# Patient Record
Sex: Female | Born: 1965 | Race: Black or African American | Hispanic: No | Smoking: Never smoker
Health system: Southern US, Community
[De-identification: ages and names within clinical notes are randomized; demographics above are authoritative.]

## PROBLEM LIST (undated history)

## (undated) DIAGNOSIS — F39 Unspecified mood [affective] disorder: Secondary | ICD-10-CM

## (undated) DIAGNOSIS — J302 Other seasonal allergic rhinitis: Secondary | ICD-10-CM

## (undated) DIAGNOSIS — D649 Anemia, unspecified: Secondary | ICD-10-CM

## (undated) DIAGNOSIS — G43909 Migraine, unspecified, not intractable, without status migrainosus: Secondary | ICD-10-CM

## (undated) DIAGNOSIS — I8000 Phlebitis and thrombophlebitis of superficial vessels of unspecified lower extremity: Secondary | ICD-10-CM

## (undated) DIAGNOSIS — M2550 Pain in unspecified joint: Secondary | ICD-10-CM

## (undated) DIAGNOSIS — F419 Anxiety disorder, unspecified: Secondary | ICD-10-CM

## (undated) DIAGNOSIS — Z9889 Other specified postprocedural states: Secondary | ICD-10-CM

## (undated) DIAGNOSIS — R600 Localized edema: Secondary | ICD-10-CM

## (undated) DIAGNOSIS — L988 Other specified disorders of the skin and subcutaneous tissue: Secondary | ICD-10-CM

## (undated) DIAGNOSIS — N951 Menopausal and female climacteric states: Secondary | ICD-10-CM

## (undated) DIAGNOSIS — R011 Cardiac murmur, unspecified: Secondary | ICD-10-CM

## (undated) DIAGNOSIS — R112 Nausea with vomiting, unspecified: Secondary | ICD-10-CM

## (undated) HISTORY — DX: Anemia, unspecified: D64.9

## (undated) HISTORY — DX: Phlebitis and thrombophlebitis of superficial vessels of unspecified lower extremity: I80.00

## (undated) HISTORY — DX: Other seasonal allergic rhinitis: J30.2

## (undated) HISTORY — DX: Menopausal and female climacteric states: N95.1

## (undated) HISTORY — DX: Unspecified mood (affective) disorder: F39

## (undated) HISTORY — PX: HERNIA REPAIR: SHX51

## (undated) HISTORY — DX: Migraine, unspecified, not intractable, without status migrainosus: G43.909

## (undated) HISTORY — DX: Pain in unspecified joint: M25.50

## (undated) HISTORY — DX: Localized edema: R60.0

## (undated) HISTORY — DX: Other specified disorders of the skin and subcutaneous tissue: L98.8

## (undated) HISTORY — DX: Anxiety disorder, unspecified: F41.9

---

## 1992-11-17 HISTORY — PX: TUBAL LIGATION: SHX77

## 2005-07-31 ENCOUNTER — Emergency Department (HOSPITAL_COMMUNITY): Admission: EM | Admit: 2005-07-31 | Discharge: 2005-07-31 | Payer: Self-pay | Admitting: Emergency Medicine

## 2005-12-29 ENCOUNTER — Other Ambulatory Visit: Admission: RE | Admit: 2005-12-29 | Discharge: 2005-12-29 | Payer: Self-pay | Admitting: Obstetrics and Gynecology

## 2008-03-28 ENCOUNTER — Ambulatory Visit (HOSPITAL_COMMUNITY): Admission: RE | Admit: 2008-03-28 | Discharge: 2008-03-28 | Payer: Self-pay | Admitting: Specialist

## 2008-03-28 ENCOUNTER — Encounter: Payer: Self-pay | Admitting: Internal Medicine

## 2008-08-17 ENCOUNTER — Ambulatory Visit: Payer: Self-pay | Admitting: Internal Medicine

## 2008-08-17 DIAGNOSIS — E669 Obesity, unspecified: Secondary | ICD-10-CM | POA: Insufficient documentation

## 2008-08-17 DIAGNOSIS — M79609 Pain in unspecified limb: Secondary | ICD-10-CM | POA: Insufficient documentation

## 2008-08-19 DIAGNOSIS — R030 Elevated blood-pressure reading, without diagnosis of hypertension: Secondary | ICD-10-CM | POA: Insufficient documentation

## 2008-08-19 DIAGNOSIS — F3289 Other specified depressive episodes: Secondary | ICD-10-CM | POA: Insufficient documentation

## 2008-08-19 DIAGNOSIS — J309 Allergic rhinitis, unspecified: Secondary | ICD-10-CM | POA: Insufficient documentation

## 2008-08-19 DIAGNOSIS — I809 Phlebitis and thrombophlebitis of unspecified site: Secondary | ICD-10-CM | POA: Insufficient documentation

## 2008-08-19 DIAGNOSIS — M1990 Unspecified osteoarthritis, unspecified site: Secondary | ICD-10-CM | POA: Insufficient documentation

## 2008-08-19 DIAGNOSIS — G43009 Migraine without aura, not intractable, without status migrainosus: Secondary | ICD-10-CM | POA: Insufficient documentation

## 2008-08-19 DIAGNOSIS — R946 Abnormal results of thyroid function studies: Secondary | ICD-10-CM | POA: Insufficient documentation

## 2008-08-19 DIAGNOSIS — R32 Unspecified urinary incontinence: Secondary | ICD-10-CM | POA: Insufficient documentation

## 2008-08-19 DIAGNOSIS — F4310 Post-traumatic stress disorder, unspecified: Secondary | ICD-10-CM | POA: Insufficient documentation

## 2008-08-19 DIAGNOSIS — F329 Major depressive disorder, single episode, unspecified: Secondary | ICD-10-CM | POA: Insufficient documentation

## 2008-08-19 DIAGNOSIS — D649 Anemia, unspecified: Secondary | ICD-10-CM | POA: Insufficient documentation

## 2008-08-19 DIAGNOSIS — G4700 Insomnia, unspecified: Secondary | ICD-10-CM | POA: Insufficient documentation

## 2008-08-19 DIAGNOSIS — Z8679 Personal history of other diseases of the circulatory system: Secondary | ICD-10-CM | POA: Insufficient documentation

## 2008-10-03 ENCOUNTER — Ambulatory Visit (HOSPITAL_COMMUNITY): Admission: RE | Admit: 2008-10-03 | Discharge: 2008-10-03 | Payer: Self-pay | Admitting: Specialist

## 2008-11-23 ENCOUNTER — Encounter: Admission: RE | Admit: 2008-11-23 | Discharge: 2008-11-23 | Payer: Self-pay | Admitting: Obstetrics and Gynecology

## 2009-02-05 ENCOUNTER — Encounter: Admission: RE | Admit: 2009-02-05 | Discharge: 2009-02-05 | Payer: Self-pay | Admitting: Obstetrics and Gynecology

## 2009-04-02 ENCOUNTER — Encounter: Admission: RE | Admit: 2009-04-02 | Discharge: 2009-04-02 | Payer: Self-pay | Admitting: *Deleted

## 2009-06-20 ENCOUNTER — Emergency Department (HOSPITAL_COMMUNITY): Admission: EM | Admit: 2009-06-20 | Discharge: 2009-06-20 | Payer: Self-pay | Admitting: Emergency Medicine

## 2009-06-22 ENCOUNTER — Encounter: Admission: RE | Admit: 2009-06-22 | Discharge: 2009-09-20 | Payer: Self-pay | Admitting: *Deleted

## 2010-05-18 ENCOUNTER — Ambulatory Visit: Payer: Self-pay | Admitting: Family Medicine

## 2010-05-18 DIAGNOSIS — N3000 Acute cystitis without hematuria: Secondary | ICD-10-CM | POA: Insufficient documentation

## 2010-05-18 LAB — CONVERTED CEMR LAB
Beta hcg, urine, semiquantitative: NEGATIVE
Glucose, Urine, Semiquant: NEGATIVE
Ketones, urine, test strip: NEGATIVE
Nitrite: NEGATIVE
Specific Gravity, Urine: 1.02
Urobilinogen, UA: 0.2
pH: 7

## 2010-10-04 ENCOUNTER — Ambulatory Visit: Payer: Self-pay | Admitting: Emergency Medicine

## 2010-10-04 LAB — CONVERTED CEMR LAB
Bilirubin Urine: NEGATIVE
Glucose, Urine, Semiquant: NEGATIVE
Ketones, urine, test strip: NEGATIVE
pH: 5.5

## 2010-10-05 ENCOUNTER — Encounter: Payer: Self-pay | Admitting: Emergency Medicine

## 2010-10-07 ENCOUNTER — Telehealth (INDEPENDENT_AMBULATORY_CARE_PROVIDER_SITE_OTHER): Payer: Self-pay

## 2010-12-08 ENCOUNTER — Encounter: Payer: Self-pay | Admitting: Specialist
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
## 2010-12-17 NOTE — Progress Notes (Signed)
Summary: Courtesy call  Phone Note Outgoing Call   Call placed by: Areta Haber CMA,  October 07, 2010 9:40 AM Call placed to: Patient Summary of Call: Courtesy call to pt - Per pt's husband, pt was at work. Asked pt's husband to let pt know we clld to check on her and that she could clld to obtain lab results, he stated he would.  Initial call taken by: Areta Haber CMA,  October 07, 2010 9:43 AM     Appended Document: Courtesy call Pt clld back - advsd her of Urine culture results, to make sure she completed all of her antibiotics, to f/u with her PCP if problems persist. Pt stated she understood and would.

## 2011-02-22 LAB — URINE CULTURE: Culture: NO GROWTH

## 2011-02-22 LAB — URINALYSIS, ROUTINE W REFLEX MICROSCOPIC
Glucose, UA: NEGATIVE mg/dL
Leukocytes, UA: NEGATIVE
Urobilinogen, UA: 1 mg/dL (ref 0.0–1.0)
pH: 6 (ref 5.0–8.0)

## 2011-02-22 LAB — LIPASE, BLOOD: Lipase: 14 U/L (ref 11–59)

## 2011-02-22 LAB — COMPREHENSIVE METABOLIC PANEL
AST: 22 U/L (ref 0–37)
Albumin: 4.1 g/dL (ref 3.5–5.2)
Alkaline Phosphatase: 49 U/L (ref 39–117)
BUN: 10 mg/dL (ref 6–23)
CO2: 27 mEq/L (ref 19–32)
Calcium: 9.7 mg/dL (ref 8.4–10.5)
GFR calc non Af Amer: >60 mL/min (ref >60)
Total Bilirubin: 0.6 mg/dL (ref 0.3–1.2)

## 2011-02-22 LAB — CBC
HCT: 37.6 % (ref 36.0–46.0)
RBC: 4.41 MIL/uL (ref 3.87–5.11)

## 2011-02-22 LAB — PREGNANCY, URINE: Preg Test, Ur: POSITIVE

## 2011-02-22 LAB — DIFFERENTIAL
Lymphocytes Relative: 29 % (ref 12–46)
Lymphs Abs: 1.8 10*3/uL (ref 0.7–4.0)
Monocytes Absolute: 0.5 10*3/uL (ref 0.1–1.0)
Neutrophils Relative %: 61 % (ref 43–77)

## 2011-02-22 LAB — URINE MICROSCOPIC-ADD ON

## 2011-02-22 LAB — HCG, QUANTITATIVE, PREGNANCY: hCG, Beta Chain, Quant, S: 9 m[IU]/mL — ABNORMAL HIGH (ref <5)

## 2011-05-18 LAB — HM MAMMOGRAPHY

## 2011-05-23 ENCOUNTER — Other Ambulatory Visit (INDEPENDENT_AMBULATORY_CARE_PROVIDER_SITE_OTHER): Payer: Self-pay | Admitting: General Surgery

## 2011-05-23 ENCOUNTER — Encounter (INDEPENDENT_AMBULATORY_CARE_PROVIDER_SITE_OTHER): Payer: Self-pay | Admitting: General Surgery

## 2011-05-23 ENCOUNTER — Ambulatory Visit (INDEPENDENT_AMBULATORY_CARE_PROVIDER_SITE_OTHER): Payer: Commercial Managed Care - PPO | Admitting: General Surgery

## 2011-05-23 NOTE — Progress Notes (Signed)
Subjective:     Patient ID: Gail Gentry, female   DOB: 1965/12/25, 45 y.o.   MRN: [id number]    BP 130/80  Pulse 64  Ht 5\' 7"  (1.702 m)  Wt 287 lb 8 oz (130.409 kg)  BMI 45.03 kg/m2    HPI This patient is a very pleasant 45 year old female here for consideration for surgical treatment for morbid obesity. 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 She gives an essentially lifelong history of progressive obesity. She has been through N. efforts at nonsurgical weight loss. She actually has typically very good temporary success losing up to 100 pounds at a time but then experiences progressive weight regain. The cycle has been repeated innumerable times. She finds that her weight is beginning to bother her more and more. She has significant joint pain and also stress urinary incontinence. She is very worried about her long-term health going forward at her current weight. Her sister has had successful gastric bypass surgery and is doing very well. She has been to our initial information seminar and reviewed her options carefully and is interested in gastric bypass.  Past Medical History  Diagnosis Date  . Anemia   . Joint pain   . Seasonal allergies   . Edema of both legs    Past Surgical History  Procedure Date  . Tubal ligation 1994  . Hernia repair Umbilical   Current Outpatient Prescriptions  Medication Sig Dispense Refill  . acetaminophen (TYLENOL) 500 MG tablet Take 500 mg by mouth as needed.        . ergocalciferol (VITAMIN D2) 50000 UNITS capsule Take 50,000 Units by mouth 2 (two) times a week.        . FeAsp-FeFum -Suc-C-Thre-B12-FA (MULTIGEN PLUS PO) Take by mouth 2 (two) times daily.        . fexofenadine (ALLEGRA) 180 MG tablet Take 180 mg by mouth daily.        . furosemide (LASIX) 40 MG tablet Take 40 mg by mouth daily.        Marland Kitchen ibuprofen (ADVIL,MOTRIN) 200 MG tablet Take 200 mg by mouth as needed.        Marland Kitchen lisdexamfetamine (VYVANSE) 70 MG capsule  Take 70 mg by mouth every morning.         No Known Allergies    Review of Systems  Constitutional: Positive for fatigue. Negative for fever.  HENT: Negative for hearing loss, nosebleeds, sore throat, trouble swallowing and neck pain.   Eyes: Negative for visual disturbance.  Respiratory: Negative for apnea, cough, chest tightness, shortness of breath, wheezing and stridor.   Cardiovascular: Positive for leg swelling. Negative for chest pain and palpitations.  Gastrointestinal: Negative for nausea, vomiting, abdominal pain, diarrhea, constipation, blood in stool and abdominal distention.  Genitourinary: Positive for urgency. Negative for dysuria and frequency.  Musculoskeletal: Positive for arthralgias.  Neurological: Positive for headaches. Negative for seizures and syncope.  Hematological: Does not bruise/bleed easily.  Psychiatric/Behavioral: Negative for behavioral problems.       Objective:   Physical Exam Gen.: Morbidly obese African American female who otherwise appears well. Skin warm and dry without infection HEENT: No palpable masses or thyromegaly. Sclera nonicteric. Pupils equal round reactive. Oropharynx clear. Lymph nodes: No cervical supraclavicular or inguinal nodes palpable Lungs: Clear equal with no increased work of breathing Cardiovascular: 2/6 systolic ejection murmur. Regular rate and rhythm. No edema. No JVD. Peripheral pulses intact. Abdomen: Obese. Well-healed periumbilical incision. No tenderness. No discernible masses or organomegaly.  Extremities: No joint swelling deformity or edema Neurologic: Alert and fully oriented. Gait normal.    Assessment:     A 45 year old female with progressive morbid obesity unresponsive to multiple efforts at medical management. She presents at a BMI of 45 and comorbidities of chronic joint pain lower extremity edema and stress urinary incontinence. She has significant risk going forward at her current weight and I believe  would have significant benefit with surgical long-term weight loss.    Plan:     We had a long discussion regarding current surgical options including lap band and Roux-en-Y gastric bypass. We discussed gastric bypass in detail including expected results, the nature of the procedure, and expected recovery period we discussed early complications such as anesthetic risk bleeding infection and leakage and possible need for open procedure. We discussed long-term risks including gallstones ulcer or stenosis internal hernia with bowel obstruction and weight regained. We discussed nutritional and vitamin deficiencies. She was given a complete consent form to review.  Following our discussion she strongly desires to proceed with gastric bypass which I believe would be a good choice for her. We will proceed with psychologic and nutritional assessments. We'll obtain routine lab work gallbladder ultrasound and check for H. pylori. We'll see her back following these studies.

## 2011-05-23 NOTE — Patient Instructions (Signed)
Carefully reviewed the consent form and call for any questions

## 2011-06-06 ENCOUNTER — Ambulatory Visit (HOSPITAL_COMMUNITY): Payer: Commercial Managed Care - PPO

## 2011-06-10 ENCOUNTER — Ambulatory Visit (HOSPITAL_COMMUNITY)
Admission: RE | Admit: 2011-06-10 | Discharge: 2011-06-10 | Disposition: A | Discharge status: Home / Self Care | Payer: 59 | Source: Ambulatory Visit | Attending: General Surgery | Admitting: General Surgery

## 2011-06-10 ENCOUNTER — Other Ambulatory Visit (INDEPENDENT_AMBULATORY_CARE_PROVIDER_SITE_OTHER): Payer: Self-pay | Admitting: General Surgery

## 2011-06-10 DIAGNOSIS — Z01818 Encounter for other preprocedural examination: Secondary | ICD-10-CM | POA: Insufficient documentation

## 2011-06-10 DIAGNOSIS — Z6841 Body Mass Index (BMI) 40.0 and over, adult: Secondary | ICD-10-CM | POA: Insufficient documentation

## 2011-06-10 LAB — COMPREHENSIVE METABOLIC PANEL
ALT: 8 U/L (ref 0–35)
AST: 14 U/L (ref 0–37)
CO2: 28 mEq/L (ref 19–32)
Creat: 0.78 mg/dL (ref 0.50–1.10)
Total Bilirubin: 0.6 mg/dL (ref 0.3–1.2)

## 2011-06-10 LAB — CBC WITH DIFFERENTIAL/PLATELET
Eosinophils Absolute: 0.1 10*3/uL (ref 0.0–0.7)
HCT: 37.8 % (ref 36.0–46.0)
Hemoglobin: 11.9 g/dL — ABNORMAL LOW (ref 12.0–15.0)
Lymphs Abs: 1.8 10*3/uL (ref 0.7–4.0)
MCH: 27.3 pg (ref 26.0–34.0)
MCHC: 31.5 g/dL (ref 30.0–36.0)
MCV: 86.7 fL (ref 78.0–100.0)
Neutro Abs: 4.9 10*3/uL (ref 1.7–7.7)
WBC: 7.4 10*3/uL (ref 4.0–10.5)

## 2011-06-10 LAB — LIPID PANEL
Cholesterol: 172 mg/dL (ref 0–200)
Total CHOL/HDL Ratio: 3.1 Ratio
VLDL: 11 mg/dL (ref 0–40)

## 2011-06-10 LAB — TSH: TSH: 0.829 u[IU]/mL (ref 0.350–4.500)

## 2011-06-16 ENCOUNTER — Ambulatory Visit: Payer: Commercial Managed Care - PPO | Admitting: *Deleted

## 2011-06-17 ENCOUNTER — Ambulatory Visit (HOSPITAL_COMMUNITY)
Admission: RE | Admit: 2011-06-17 | Discharge: 2011-06-17 | Disposition: A | Discharge status: Home / Self Care | Payer: 59 | Source: Ambulatory Visit | Attending: General Surgery | Admitting: General Surgery

## 2011-06-17 ENCOUNTER — Other Ambulatory Visit: Payer: Self-pay

## 2011-06-17 DIAGNOSIS — Z13820 Encounter for screening for osteoporosis: Secondary | ICD-10-CM | POA: Insufficient documentation

## 2011-06-17 DIAGNOSIS — N393 Stress incontinence (female) (male): Secondary | ICD-10-CM | POA: Insufficient documentation

## 2011-06-17 DIAGNOSIS — M2550 Pain in unspecified joint: Secondary | ICD-10-CM | POA: Insufficient documentation

## 2011-06-17 DIAGNOSIS — Z6841 Body Mass Index (BMI) 40.0 and over, adult: Secondary | ICD-10-CM | POA: Insufficient documentation

## 2011-06-29 ENCOUNTER — Inpatient Hospital Stay (INDEPENDENT_AMBULATORY_CARE_PROVIDER_SITE_OTHER)
Admission: RE | Admit: 2011-06-29 | Discharge: 2011-06-29 | Disposition: A | Discharge status: Home / Self Care | Payer: 59 | Source: Ambulatory Visit | Attending: Emergency Medicine | Admitting: Emergency Medicine

## 2011-06-29 ENCOUNTER — Encounter: Payer: Self-pay | Admitting: Emergency Medicine

## 2011-06-29 DIAGNOSIS — N390 Urinary tract infection, site not specified: Secondary | ICD-10-CM

## 2011-06-29 LAB — CONVERTED CEMR LAB
Bilirubin Urine: NEGATIVE
Glucose, Urine, Semiquant: NEGATIVE
Ketones, urine, test strip: NEGATIVE
Nitrite: NEGATIVE
Specific Gravity, Urine: 1.025
Urobilinogen, UA: 0.2
pH: 5.5

## 2011-07-01 ENCOUNTER — Encounter: Payer: Self-pay | Admitting: Family Medicine

## 2011-07-01 ENCOUNTER — Inpatient Hospital Stay (INDEPENDENT_AMBULATORY_CARE_PROVIDER_SITE_OTHER)
Admission: RE | Admit: 2011-07-01 | Discharge: 2011-07-01 | Disposition: A | Discharge status: Home / Self Care | Payer: 59 | Source: Ambulatory Visit | Attending: Family Medicine | Admitting: Family Medicine

## 2011-07-01 DIAGNOSIS — T7840XA Allergy, unspecified, initial encounter: Secondary | ICD-10-CM

## 2011-07-01 DIAGNOSIS — R21 Rash and other nonspecific skin eruption: Secondary | ICD-10-CM

## 2011-07-07 ENCOUNTER — Encounter: Payer: Self-pay | Admitting: *Deleted

## 2011-07-07 ENCOUNTER — Encounter: Payer: 59 | Attending: General Surgery | Admitting: *Deleted

## 2011-07-07 DIAGNOSIS — Z713 Dietary counseling and surveillance: Secondary | ICD-10-CM | POA: Insufficient documentation

## 2011-07-07 DIAGNOSIS — Z01818 Encounter for other preprocedural examination: Secondary | ICD-10-CM | POA: Insufficient documentation

## 2011-07-07 NOTE — Progress Notes (Signed)
  Patient was seen on 07/07/2011 for Pre-Operative Gastric Bypass Nutrition Assessment. Assessment and letter of approval faxed to [facility] Bariatric Surgery Program coordinator on 07/07/2011.    Patient to call for Pre-Op and Post-Op Nutrition Education at [facility] when surgery is scheduled.  *Note pt seen from 10:45am - 11:30am

## 2011-07-07 NOTE — Patient Instructions (Signed)
   Follow Pre-Op Nutrition Goals to prepare for Gastric Bypass Surgery.   Call the [facility] at [phone] once you have been given your surgery date to enrolled in the Pre-Op Nutrition Class. You will need to attend this nutrition class 3-4 weeks prior to your surgery. 

## 2011-08-20 ENCOUNTER — Emergency Department (HOSPITAL_COMMUNITY): Payer: 59

## 2011-08-20 ENCOUNTER — Emergency Department (HOSPITAL_COMMUNITY)
Admission: EM | Admit: 2011-08-20 | Discharge: 2011-08-20 | Disposition: A | Discharge status: Home / Self Care | Payer: 59 | Attending: Emergency Medicine | Admitting: Emergency Medicine

## 2011-08-20 DIAGNOSIS — R109 Unspecified abdominal pain: Secondary | ICD-10-CM | POA: Insufficient documentation

## 2011-08-20 DIAGNOSIS — K5900 Constipation, unspecified: Secondary | ICD-10-CM | POA: Insufficient documentation

## 2011-08-20 DIAGNOSIS — R197 Diarrhea, unspecified: Secondary | ICD-10-CM | POA: Insufficient documentation

## 2011-08-20 LAB — URINALYSIS, ROUTINE W REFLEX MICROSCOPIC
Nitrite: NEGATIVE
Protein, ur: NEGATIVE mg/dL
Specific Gravity, Urine: 1.029 (ref 1.005–1.030)
Urobilinogen, UA: 1 mg/dL (ref 0.0–1.0)

## 2011-08-20 LAB — BASIC METABOLIC PANEL
CO2: 26 mEq/L (ref 19–32)
Calcium: 9.4 mg/dL (ref 8.4–10.5)
GFR calc non Af Amer: >90 mL/min (ref >90)
Glucose, Bld: 94 mg/dL (ref 70–99)
Potassium: 5.4 mEq/L — ABNORMAL HIGH (ref 3.5–5.1)
Sodium: 137 mEq/L (ref 135–145)

## 2011-08-20 LAB — CBC
Hemoglobin: 11.5 g/dL — ABNORMAL LOW (ref 12.0–15.0)
MCH: 28 pg (ref 26.0–34.0)
MCHC: 33.3 g/dL (ref 30.0–36.0)
Platelets: 288 10*3/uL (ref 150–400)
RBC: 4.11 MIL/uL (ref 3.87–5.11)

## 2011-08-20 LAB — DIFFERENTIAL
Basophils Absolute: 0 10*3/uL (ref 0.0–0.1)
Basophils Relative: 0 % (ref 0–1)
Eosinophils Absolute: 0.1 10*3/uL (ref 0.0–0.7)
Monocytes Relative: 6 % (ref 3–12)
Neutro Abs: 4.1 10*3/uL (ref 1.7–7.7)
Neutrophils Relative %: 60 % (ref 43–77)

## 2011-08-20 LAB — POTASSIUM: Potassium: 3.8 mEq/L (ref 3.5–5.1)

## 2011-08-21 LAB — URINE CULTURE

## 2011-09-14 ENCOUNTER — Encounter: Payer: Self-pay | Admitting: Family Medicine

## 2011-09-14 ENCOUNTER — Inpatient Hospital Stay (INDEPENDENT_AMBULATORY_CARE_PROVIDER_SITE_OTHER)
Admission: RE | Admit: 2011-09-14 | Discharge: 2011-09-14 | Disposition: A | Discharge status: Home / Self Care | Payer: 59 | Source: Ambulatory Visit | Attending: Family Medicine | Admitting: Family Medicine

## 2011-09-14 DIAGNOSIS — J029 Acute pharyngitis, unspecified: Secondary | ICD-10-CM

## 2011-09-14 DIAGNOSIS — J069 Acute upper respiratory infection, unspecified: Secondary | ICD-10-CM

## 2011-09-14 LAB — CONVERTED CEMR LAB: Rapid Strep: NEGATIVE

## 2011-09-19 ENCOUNTER — Telehealth (INDEPENDENT_AMBULATORY_CARE_PROVIDER_SITE_OTHER): Payer: Self-pay | Admitting: Emergency Medicine

## 2011-10-02 ENCOUNTER — Ambulatory Visit: Payer: 59

## 2011-10-14 ENCOUNTER — Encounter: Payer: 59 | Admitting: *Deleted

## 2011-10-14 ENCOUNTER — Encounter: Payer: Self-pay | Admitting: *Deleted

## 2011-10-16 ENCOUNTER — Encounter: Payer: 59 | Attending: General Surgery | Admitting: *Deleted

## 2011-10-16 DIAGNOSIS — Z713 Dietary counseling and surveillance: Secondary | ICD-10-CM | POA: Insufficient documentation

## 2011-10-16 DIAGNOSIS — Z9884 Bariatric surgery status: Secondary | ICD-10-CM | POA: Insufficient documentation

## 2011-10-16 DIAGNOSIS — Z01818 Encounter for other preprocedural examination: Secondary | ICD-10-CM | POA: Insufficient documentation

## 2011-10-16 NOTE — Progress Notes (Signed)
  Bariatric Class:  Appt start time: 1700 end time:  1800.  Pre-Operative Nutrition Class  Patient was seen on 10/16/2011 for Pre-Operative Nutrition education at [facility].   Surgery date: 12/17 Surgery type: Gastric Bypass  Weight today: 304.7 lb Weight change: 18.1 lb gain Total weight lost: n/a BMI: 47.8  Start wt @ [facility]: 286.6 lbs  Samples given per MNT protocol:  Bariatric Advantage Multivitamin  Lot # [id number]  Exp: 9/13   Bariatric Advantage Calcium Citrate  Lot # [id number]  Exp: 12/13   Celebrate Vitamins Multivitamin  Lot # [id number]  Exp: 6/14   Celebrate VitaminsCalcium Citrate  Lot # [id number]  Exp: 9/13   The following the learning objective met by the patient during this course:   Identifies Pre-Op Dietary Goals and will begin 2 weeks pre-operatively   Identifies appropriate sources of fluids and proteins   States protein recommendations and appropriate sources pre and post-operatively  Identifies Post-Operative Dietary Goals and will follow for 2 weeks post-operatively  Identifies appropriate multivitamin and calcium sources  Describes the need for physical activity post-operatively and will follow MD recommendations  States when to call healthcare provider regarding medication questions or post-operative complications  Handouts given during class include:  Pre-Op Bariatric Surgery Diet Handout  Protein Shake Handout  Post-Op Bariatric Surgery Nutrition Handout  BELT Program Information Flyer  Support Group Information Flyer  Follow-Up Plan: Patient will follow-up at [facility] 2 weeks post operatively for diet advancement per MD.

## 2011-10-16 NOTE — Patient Instructions (Signed)
Follow:    Pre-Op Diet per MD 2 weeks prior to surgery  Phase 2- Liquids (clear/full) 2 weeks after surgery  Vitamin/Mineral/Calcium guidelines for purchasing bariatric supplements  Exercise guidelines pre and post-op per MD  Follow-up at [facility] in 2 weeks post-op for diet advancement. Contact Jafet Wissing as needed with questions/concerns.  
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
## 2011-10-20 NOTE — Telephone Encounter (Signed)
  Phone Note Outgoing Call   Call placed by: Lavell Islam RN,  September 19, 2011 10:54 AM Call placed to: Patient Action Taken: Phone Call Completed Summary of Call: Left message on voice mail inquiring about patient's status; informing her that 48 hr throat culture negative; encouraging her to call with questions/concerns. Initial call taken by: Lavell Islam RN,  September 19, 2011 10:55 AM
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
## 2011-10-24 ENCOUNTER — Encounter (INDEPENDENT_AMBULATORY_CARE_PROVIDER_SITE_OTHER): Payer: Self-pay | Admitting: General Surgery

## 2011-10-24 ENCOUNTER — Ambulatory Visit (INDEPENDENT_AMBULATORY_CARE_PROVIDER_SITE_OTHER): Payer: Commercial Managed Care - PPO | Admitting: General Surgery

## 2011-10-24 VITALS — BP 124/84 | HR 72 | Temp 96.9°F | Resp 16 | Ht 66.0 in | Wt 300.4 lb

## 2011-10-24 DIAGNOSIS — E669 Obesity, unspecified: Secondary | ICD-10-CM

## 2011-10-24 NOTE — Progress Notes (Signed)
Subjective:   Morbid Obesity  Patient ID: Gail Gentry, female   DOB: [birth year], 45 y.o.   MRN: [id number]  HPI The patient returns to the office for her preoperative visit prior to planned laparoscopic Roux-en-Y gastric bypass. There is been no change in her health since her last visit. We reviewed her workup. There were no concerns on psychologic or nutritional evaluation. Preoperative chest x-ray, abdominal ultrasound, EKG, and lab work were unremarkable. H. pylori was negative. We again reviewed the procedure. The consent form was reviewed line by line and all her questions were answered.  Past Medical History  Diagnosis Date  . Anemia   . Joint pain   . Seasonal allergies   . Edema of both legs    Past Surgical History  Procedure Date  . Tubal ligation 1994  . Hernia repair Umbilical   Current Outpatient Prescriptions  Medication Sig Dispense Refill  . acetaminophen (TYLENOL) 500 MG tablet Take 500 mg by mouth as needed.        . ergocalciferol (VITAMIN D2) 50000 UNITS capsule Take 50,000 Units by mouth 2 (two) times a week.        . FeAsp-FeFum -Suc-C-Thre-B12-FA (MULTIGEN PLUS PO) Take by mouth 2 (two) times daily.        . fexofenadine (ALLEGRA) 180 MG tablet Take 180 mg by mouth daily.        . furosemide (LASIX) 40 MG tablet Take 40 mg by mouth daily.        Marland Kitchen ibuprofen (ADVIL,MOTRIN) 200 MG tablet Take 200 mg by mouth as needed.        Marland Kitchen lisdexamfetamine (VYVANSE) 70 MG capsule Take 70 mg by mouth every morning.         Allergies  Allergen Reactions  . Benadryl (Altaryl)   . Sulfa Antibiotics   . Tomato    History  Substance Use Topics  . Smoking status: Never Smoker   . Smokeless tobacco: Not on file  . Alcohol Use: No    Review of Systems  Respiratory: Negative.   Cardiovascular: Negative.   Musculoskeletal: Positive for arthralgias.       Objective:   Physical Exam Physical Exam  Gen.: Morbidly obese African American female who otherwise appears  well.  Skin warm and dry without infection  HEENT: No palpable masses or thyromegaly. Sclera nonicteric. Pupils equal round reactive. Oropharynx clear.  Lymph nodes: No cervical supraclavicular or inguinal nodes palpable  Lungs: Clear equal with no increased work of breathing  Cardiovascular: 2/6 systolic ejection murmur. Regular rate and rhythm. No edema. No JVD. Peripheral pulses intact.  Abdomen: Obese. Well-healed periumbilical incision. No tenderness. No discernible masses or organomegaly.  Extremities: No joint swelling deformity or edema  Neurologic: Alert and fully oriented. Gait normal     Assessment:     45 year old female with progressive morbid obesity unresponsive to numerous attempts at medical management and presents at 287 pounds and BMI of 45 With comorbidities of chronic joint pain and stress urinary incontinence    Plan:     Laparoscopic Roux-en-Y gastric bypass

## 2011-10-24 NOTE — Patient Instructions (Signed)
Follow preop diet and bowel prep instructions and call us for any questions

## 2011-10-27 ENCOUNTER — Encounter (HOSPITAL_COMMUNITY): Payer: Self-pay | Admitting: Pharmacy Technician

## 2011-10-29 ENCOUNTER — Encounter (HOSPITAL_COMMUNITY): Payer: Self-pay

## 2011-10-29 ENCOUNTER — Encounter (HOSPITAL_COMMUNITY)
Admission: RE | Admit: 2011-10-29 | Discharge: 2011-10-29 | Disposition: A | Discharge status: Home / Self Care | Payer: 59 | Source: Ambulatory Visit | Attending: General Surgery | Admitting: General Surgery

## 2011-10-29 HISTORY — DX: Other specified postprocedural states: Z98.890

## 2011-10-29 HISTORY — DX: Cardiac murmur, unspecified: R01.1

## 2011-10-29 HISTORY — DX: Other specified postprocedural states: R11.2

## 2011-10-29 LAB — CBC
HCT: 36.2 % (ref 36.0–46.0)
Hemoglobin: 11.8 g/dL — ABNORMAL LOW (ref 12.0–15.0)
MCV: 82.8 fL (ref 78.0–100.0)
RBC: 4.37 MIL/uL (ref 3.87–5.11)
RDW: 13.7 % (ref 11.5–15.5)
WBC: 8.2 10*3/uL (ref 4.0–10.5)

## 2011-10-29 LAB — COMPREHENSIVE METABOLIC PANEL
Albumin: 3.8 g/dL (ref 3.5–5.2)
Alkaline Phosphatase: 56 U/L (ref 39–117)
BUN: 16 mg/dL (ref 6–23)
CO2: 26 mEq/L (ref 19–32)
Chloride: 100 mEq/L (ref 96–112)
Creatinine, Ser: 0.69 mg/dL (ref 0.50–1.10)
GFR calc non Af Amer: >90 mL/min (ref >90)
Glucose, Bld: 81 mg/dL (ref 70–99)
Potassium: 3.6 mEq/L (ref 3.5–5.1)
Total Bilirubin: 0.3 mg/dL (ref 0.3–1.2)

## 2011-10-29 LAB — DIFFERENTIAL
Eosinophils Relative: 2 % (ref 0–5)
Lymphocytes Relative: 28 % (ref 12–46)
Lymphs Abs: 2.3 10*3/uL (ref 0.7–4.0)
Monocytes Absolute: 0.6 10*3/uL (ref 0.1–1.0)
Neutro Abs: 5.2 10*3/uL (ref 1.7–7.7)

## 2011-10-29 LAB — HCG, SERUM, QUALITATIVE: Preg, Serum: NEGATIVE

## 2011-10-29 NOTE — Pre-Procedure Instructions (Signed)
ekg and 2 view chest xray 06-17-11 in epic

## 2011-10-29 NOTE — Patient Instructions (Addendum)
20 Gail Gentry  10/29/2011   Your procedure is scheduled on:11-03-2011  Report to [facility] at 915 AM.  Call this number if you have problems the morning of surgery: [phone]   Remember:   Do not eat food:After Midnight.  May have clear liquids:until Midnight .  Clear liquids include soda, tea, black coffee, apple or grape juice, broth.  Take these medicines the morning of surgery with A SIP OF WATER: alleegra, vyvanse   Do not wear jewelry, make-up or nail polish.  Do not wear lotions, powders, or perfumes. .  Do not shave 48 hours prior to surgery.  Do not bring valuables to the hospital.  Contacts, dentures or bridgework may not be worn into surgery.  Leave suitcase in the car. After surgery it may be brought to your room.  For patients admitted to the hospital, checkout time is 11:00 AM the day of discharge.   Special Instructions: CHG Shower Use Special Wash: 1/2 bottle night before surgery and 1/2 bottle morning of surgery.neck down avoid private area   Please read over the following fact sheets that you were given: MRSA Information, incentive spirometer fact sheet  [name] [name] rn [facility] pre op nurse [phone] phone number

## 2011-10-31 NOTE — Pre-Procedure Instructions (Signed)
1210- no antibiotic as to now in Epic- called office- spoke with Marcelino Duster at office in nursing office who stated she would talk with Dr Carmie Kanner nurse as he is on vacation

## 2011-10-31 NOTE — Pre-Procedure Instructions (Signed)
No order as of yet for pre op antibiotics   [phone]

## 2011-11-03 ENCOUNTER — Inpatient Hospital Stay (HOSPITAL_COMMUNITY): Payer: 59 | Admitting: Anesthesiology

## 2011-11-03 ENCOUNTER — Encounter (HOSPITAL_COMMUNITY): Payer: Self-pay | Admitting: Anesthesiology

## 2011-11-03 ENCOUNTER — Encounter (HOSPITAL_COMMUNITY)
Admission: RE | Disposition: A | Discharge status: Home / Self Care | Payer: Self-pay | Source: Ambulatory Visit | Attending: General Surgery

## 2011-11-03 ENCOUNTER — Encounter (HOSPITAL_COMMUNITY): Payer: Self-pay | Admitting: *Deleted

## 2011-11-03 ENCOUNTER — Inpatient Hospital Stay (HOSPITAL_COMMUNITY)
Admission: RE | Admit: 2011-11-03 | Discharge: 2011-11-05 | DRG: 621 | Disposition: A | Discharge status: Home / Self Care | Payer: 59 | Source: Ambulatory Visit | Attending: General Surgery | Admitting: General Surgery

## 2011-11-03 DIAGNOSIS — Z96659 Presence of unspecified artificial knee joint: Secondary | ICD-10-CM

## 2011-11-03 DIAGNOSIS — E669 Obesity, unspecified: Secondary | ICD-10-CM

## 2011-11-03 DIAGNOSIS — Z6841 Body Mass Index (BMI) 40.0 and over, adult: Secondary | ICD-10-CM

## 2011-11-03 HISTORY — PX: GASTRIC ROUX-EN-Y: SHX5262

## 2011-11-03 SURGERY — LAPAROSCOPIC ROUX-EN-Y GASTRIC
Anesthesia: General | Site: Abdomen | Wound class: Clean Contaminated

## 2011-11-03 MED ORDER — BUPIVACAINE-EPINEPHRINE 0.25% -1:200000 IJ SOLN
INTRAMUSCULAR | Status: AC
  Filled 2011-11-03: qty 1

## 2011-11-03 MED ORDER — CISATRACURIUM BESYLATE 2 MG/ML IV SOLN
INTRAVENOUS | Status: DC | PRN
  Administered 2011-11-03: 10 mg via INTRAVENOUS
  Administered 2011-11-03 (×5): 2 mg via INTRAVENOUS

## 2011-11-03 MED ORDER — PROMETHAZINE HCL 25 MG/ML IJ SOLN: 6.2500 mg | INTRAMUSCULAR | Status: DC | PRN

## 2011-11-03 MED ORDER — TISSEEL VH 10 ML EX KIT
PACK | CUTANEOUS | Status: DC | PRN
  Administered 2011-11-03: 10 mL

## 2011-11-03 MED ORDER — EPHEDRINE SULFATE 50 MG/ML IJ SOLN
INTRAMUSCULAR | Status: DC | PRN
  Administered 2011-11-03: 10 mg via INTRAVENOUS

## 2011-11-03 MED ORDER — LACTATED RINGERS IV SOLN
INTRAVENOUS | Status: DC
  Administered 2011-11-03: 1000 mL via INTRAVENOUS

## 2011-11-03 MED ORDER — NEOSTIGMINE METHYLSULFATE 1 MG/ML IJ SOLN
INTRAMUSCULAR | Status: DC | PRN
  Administered 2011-11-03: 5 mg via INTRAVENOUS

## 2011-11-03 MED ORDER — HEPARIN SODIUM (PORCINE) 5000 UNIT/ML IJ SOLN
5000.0000 [IU] | Freq: Once | INTRAMUSCULAR | Status: AC
  Administered 2011-11-03: 5000 [IU] via SUBCUTANEOUS

## 2011-11-03 MED ORDER — DEXAMETHASONE SODIUM PHOSPHATE 4 MG/ML IJ SOLN
INTRAMUSCULAR | Status: DC | PRN
  Administered 2011-11-03: 10 mg via INTRAVENOUS

## 2011-11-03 MED ORDER — HEPARIN SODIUM (PORCINE) 5000 UNIT/ML IJ SOLN
5000.0000 [IU] | Freq: Three times a day (TID) | INTRAMUSCULAR | Status: DC
Start: 2011-11-03 — End: 2011-11-05
  Administered 2011-11-03 – 2011-11-05 (×5): 5000 [IU] via SUBCUTANEOUS
  Filled 2011-11-03 (×8): qty 1

## 2011-11-03 MED ORDER — GLYCOPYRROLATE 0.2 MG/ML IJ SOLN
INTRAMUSCULAR | Status: DC | PRN
  Administered 2011-11-03: .6 mg via INTRAVENOUS

## 2011-11-03 MED ORDER — LIDOCAINE HCL (CARDIAC) 20 MG/ML IV SOLN
INTRAVENOUS | Status: DC | PRN
  Administered 2011-11-03: 40 mg via INTRAVENOUS

## 2011-11-03 MED ORDER — CEFOXITIN SODIUM-DEXTROSE 1-4 GM-% IV SOLR (PREMIX)
INTRAVENOUS | Status: AC
  Filled 2011-11-03: qty 100

## 2011-11-03 MED ORDER — LACTATED RINGERS IV SOLN
INTRAVENOUS | Status: DC | PRN
  Administered 2011-11-03 (×3): via INTRAVENOUS

## 2011-11-03 MED ORDER — LACTATED RINGERS IV SOLN
INTRAVENOUS | Status: DC
  Administered 2011-11-03 – 2011-11-04 (×2): via INTRAVENOUS

## 2011-11-03 MED ORDER — DEXTROSE 5 % IV SOLN: 2.0000 g | Freq: Once | INTRAVENOUS | Status: DC

## 2011-11-03 MED ORDER — UNJURY VANILLA POWDER: 2.0000 [oz_av] | Freq: Four times a day (QID) | ORAL | Status: DC

## 2011-11-03 MED ORDER — PROPOFOL 10 MG/ML IV EMUL
INTRAVENOUS | Status: DC | PRN
  Administered 2011-11-03: 200 mg via INTRAVENOUS

## 2011-11-03 MED ORDER — OXYCODONE-ACETAMINOPHEN 5-325 MG/5ML PO SOLN
5.0000 mL | ORAL | Status: DC | PRN
  Administered 2011-11-04 – 2011-11-05 (×3): 10 mL via ORAL
  Filled 2011-11-03 (×3): qty 10

## 2011-11-03 MED ORDER — HYDROMORPHONE HCL PF 1 MG/ML IJ SOLN: 0.2500 mg | INTRAMUSCULAR | Status: DC | PRN

## 2011-11-03 MED ORDER — MORPHINE SULFATE 2 MG/ML IJ SOLN
2.0000 mg | INTRAMUSCULAR | Status: DC | PRN
  Administered 2011-11-03 – 2011-11-04 (×4): 2 mg via INTRAVENOUS
  Filled 2011-11-03 (×4): qty 1

## 2011-11-03 MED ORDER — ACETAMINOPHEN 10 MG/ML IV SOLN
INTRAVENOUS | Status: AC
  Filled 2011-11-03: qty 100

## 2011-11-03 MED ORDER — HYDROMORPHONE HCL PF 1 MG/ML IJ SOLN
INTRAMUSCULAR | Status: DC | PRN
  Administered 2011-11-03: .5 mg via INTRAVENOUS
  Administered 2011-11-03 (×3): 0.5 mg via INTRAVENOUS

## 2011-11-03 MED ORDER — LACTATED RINGERS IR SOLN
Status: DC | PRN
  Administered 2011-11-03: 3000 mL

## 2011-11-03 MED ORDER — BUPIVACAINE-EPINEPHRINE 0.25% -1:200000 IJ SOLN
INTRAMUSCULAR | Status: DC | PRN
  Administered 2011-11-03: 35 mL

## 2011-11-03 MED ORDER — ACETAMINOPHEN 10 MG/ML IV SOLN
INTRAVENOUS | Status: DC | PRN
  Administered 2011-11-03: 1000 mg via INTRAVENOUS

## 2011-11-03 MED ORDER — ONDANSETRON HCL 4 MG/2ML IJ SOLN
INTRAMUSCULAR | Status: DC | PRN
  Administered 2011-11-03 (×2): 2 mg via INTRAVENOUS

## 2011-11-03 MED ORDER — ONDANSETRON HCL 4 MG/2ML IJ SOLN
4.0000 mg | INTRAMUSCULAR | Status: DC | PRN
  Administered 2011-11-04: 4 mg via INTRAVENOUS
  Filled 2011-11-03: qty 2

## 2011-11-03 MED ORDER — FENTANYL CITRATE 0.05 MG/ML IJ SOLN
INTRAMUSCULAR | Status: DC | PRN
  Administered 2011-11-03: 50 ug via INTRAVENOUS
  Administered 2011-11-03: 100 ug via INTRAVENOUS
  Administered 2011-11-03 (×2): 50 ug via INTRAVENOUS

## 2011-11-03 MED ORDER — SUCCINYLCHOLINE CHLORIDE 20 MG/ML IJ SOLN
INTRAMUSCULAR | Status: DC | PRN
  Administered 2011-11-03: 100 mg via INTRAVENOUS

## 2011-11-03 MED ORDER — UNJURY CHICKEN SOUP POWDER: 2.0000 [oz_av] | Freq: Four times a day (QID) | ORAL | Status: DC

## 2011-11-03 MED ORDER — UNJURY CHOCOLATE CLASSIC POWDER: 2.0000 [oz_av] | Freq: Four times a day (QID) | ORAL | Status: DC

## 2011-11-03 MED ORDER — DEXTROSE 5 % IV SOLN
2.0000 g | Freq: Once | INTRAVENOUS | Status: AC
  Administered 2011-11-03: 2 g via INTRAVENOUS
  Filled 2011-11-03: qty 2

## 2011-11-03 MED ORDER — ACETAMINOPHEN 160 MG/5ML PO SOLN: 650.0000 mg | ORAL | Status: DC | PRN

## 2011-11-03 SURGICAL SUPPLY — 80 items
ADH SKN CLS APL DERMABOND .7 (GAUZE/BANDAGES/DRESSINGS)
APL SKNCLS STERI-STRIP NONHPOA (GAUZE/BANDAGES/DRESSINGS)
APL SRG 32X5 SNPLK LF DISP (MISCELLANEOUS) ×1
APPLICATOR COTTON TIP 6IN STRL (MISCELLANEOUS) ×4 IMPLANT
APPLIER CLIP ROT 13.4 12 LRG (CLIP)
APR CLP LRG 13.4X12 ROT 20 MLT (CLIP)
BAG SPEC RTRVL LRG 6X4 10 (ENDOMECHANICALS)
BENZOIN TINCTURE PRP APPL 2/3 (GAUZE/BANDAGES/DRESSINGS) IMPLANT
BLADE SURG 15 STRL LF DISP TIS (BLADE) IMPLANT
BLADE SURG 15 STRL SS (BLADE)
BLADE SURG SZ11 CARB STEEL (BLADE) ×2 IMPLANT
CABLE HIGH FREQUENCY MONO STRZ (ELECTRODE) ×2 IMPLANT
CANISTER SUCTION 2500CC (MISCELLANEOUS) ×2 IMPLANT
CLIP APPLIE ROT 13.4 12 LRG (CLIP) IMPLANT
CLIP SUT LAPRA TY ABSORB (SUTURE) ×4 IMPLANT
CLOTH BEACON ORANGE TIMEOUT ST (SAFETY) ×2 IMPLANT
COVER SURGICAL LIGHT HANDLE (MISCELLANEOUS) ×2 IMPLANT
CUTTER LINEAR ENDO ART 45 ETS (STAPLE) ×2 IMPLANT
DERMABOND ADVANCED (GAUZE/BANDAGES/DRESSINGS)
DERMABOND ADVANCED .7 DNX12 (GAUZE/BANDAGES/DRESSINGS) IMPLANT
DEVICE SUTURE ENDOST 10MM (ENDOMECHANICALS) ×2 IMPLANT
DISSECTOR BLUNT TIP ENDO 5MM (MISCELLANEOUS) IMPLANT
DRAIN PENROSE 18X1/4 LTX STRL (WOUND CARE) ×2 IMPLANT
DRAPE CAMERA CLOSED 9X96 (DRAPES) ×2 IMPLANT
ELECT REM PT RETURN 9FT ADLT (ELECTROSURGICAL) ×2
ELECTRODE REM PT RTRN 9FT ADLT (ELECTROSURGICAL) ×1 IMPLANT
GAUZE SPONGE 4X4 16PLY XRAY LF (GAUZE/BANDAGES/DRESSINGS) ×2 IMPLANT
GLOVE BIOGEL PI IND STRL 7.0 (GLOVE) ×1 IMPLANT
GLOVE BIOGEL PI INDICATOR 7.0 (GLOVE) ×1
GOWN STRL NON-REIN LRG LVL3 (GOWN DISPOSABLE) ×2 IMPLANT
GOWN STRL REIN XL XLG (GOWN DISPOSABLE) ×4 IMPLANT
HEMOSTAT SURGICEL 4X8 (HEMOSTASIS) IMPLANT
HOVERMATT SINGLE USE (MISCELLANEOUS) ×2 IMPLANT
KIT BASIN OR (CUSTOM PROCEDURE TRAY) ×2 IMPLANT
KIT GASTRIC LAVAGE 34FR ADT (SET/KITS/TRAYS/PACK) ×2 IMPLANT
NDL SPNL 22GX3.5 QUINCKE BK (NEEDLE) ×1 IMPLANT
NEEDLE SPNL 22GX3.5 QUINCKE BK (NEEDLE) ×2 IMPLANT
NS IRRIG 1000ML POUR BTL (IV SOLUTION) ×2 IMPLANT
PACK CARDIOVASCULAR III (CUSTOM PROCEDURE TRAY) ×2 IMPLANT
PEN SKIN MARKING BROAD (MISCELLANEOUS) ×2 IMPLANT
POUCH SPECIMEN RETRIEVAL 10MM (ENDOMECHANICALS) IMPLANT
RELOAD 45 VASCULAR/THIN (ENDOMECHANICALS) ×4 IMPLANT
RELOAD BLUE (STAPLE) ×4 IMPLANT
RELOAD ENDO STITCH 2.0 (ENDOMECHANICALS) ×22
RELOAD GOLD (STAPLE) ×2 IMPLANT
RELOAD STAPLE 45 2.5 WHT GRN (ENDOMECHANICALS) ×2 IMPLANT
RELOAD STAPLE 45 3.5 BLU ETS (ENDOMECHANICALS) ×1 IMPLANT
RELOAD STAPLE TA45 3.5 REG BLU (ENDOMECHANICALS) ×4 IMPLANT
RELOAD SUT SNGL STCH BLK 2-0 (ENDOMECHANICALS) ×3 IMPLANT
RELOAD WHITE ECR60W (STAPLE) ×1 IMPLANT
SCALPEL HARMONIC ACE (MISCELLANEOUS) ×2 IMPLANT
SCISSORS LAP 5X35 DISP (ENDOMECHANICALS) ×2 IMPLANT
SEALANT SURGICAL APPL DUAL CAN (MISCELLANEOUS) ×2 IMPLANT
SET IRRIG TUBING LAPAROSCOPIC (IRRIGATION / IRRIGATOR) ×2 IMPLANT
SLEEVE ADV FIXATION 12X100MM (TROCAR) ×4 IMPLANT
SLEEVE ADV FIXATION 5X100MM (TROCAR) ×2 IMPLANT
SOLUTION ANTI FOG 6CC (MISCELLANEOUS) ×2 IMPLANT
SPONGE GAUZE 4X4 12PLY (GAUZE/BANDAGES/DRESSINGS) ×2 IMPLANT
STAPLER STANDARD HANDLE (STAPLE) ×2 IMPLANT
STAPLER VISISTAT 35W (STAPLE) ×2 IMPLANT
STRIP CLOSURE SKIN 1/2X4 (GAUZE/BANDAGES/DRESSINGS) IMPLANT
SUT ETHILON 3 0 PS 1 (SUTURE) IMPLANT
SUT MNCRL AB 4-0 PS2 18 (SUTURE) ×2 IMPLANT
SUT RELOAD ENDO STITCH 2 48X1 (ENDOMECHANICALS) ×5
SUT RELOAD ENDO STITCH 2.0 (ENDOMECHANICALS) ×6
SUT VIC AB 2-0 SH 27 (SUTURE) ×2
SUT VIC AB 2-0 SH 27X BRD (SUTURE) ×1 IMPLANT
SUTURE RELOAD END STTCH 2 48X1 (ENDOMECHANICALS) ×5 IMPLANT
SUTURE RELOAD ENDO STITCH 2.0 (ENDOMECHANICALS) ×6 IMPLANT
SYR 20CC LL (SYRINGE) ×2 IMPLANT
SYR 50ML LL SCALE MARK (SYRINGE) ×2 IMPLANT
SYR CONTROL 10ML LL (SYRINGE) ×2 IMPLANT
TOWEL OR 17X26 10 PK STRL BLUE (TOWEL DISPOSABLE) ×2 IMPLANT
TRAY FOLEY CATH 14FRSI W/METER (CATHETERS) ×2 IMPLANT
TROCAR ADV FIXATION 12X100MM (TROCAR) ×2 IMPLANT
TROCAR XCEL 12X100 BLDLESS (ENDOMECHANICALS) ×2 IMPLANT
TROCAR Z-THREAD FIOS 5X100MM (TROCAR) ×2 IMPLANT
TUBING ENDO SMARTCAP (MISCELLANEOUS) ×2 IMPLANT
TUBING FILTER THERMOFLATOR (ELECTROSURGICAL) ×2 IMPLANT
WATER STERILE IRR 1500ML POUR (IV SOLUTION) ×2 IMPLANT

## 2011-11-03 NOTE — H&P (View-Only) (Signed)
Subjective:   Morbid Obesity  Patient ID: Gail Gentry, female   DOB: 09/22/1966, 45 y.o.   MRN: [id number]  HPI The patient returns to the office for her preoperative visit prior to planned laparoscopic Roux-en-Y gastric bypass. There is been no change in her health since her last visit. We reviewed her workup. There were no concerns on psychologic or nutritional evaluation. Preoperative chest x-ray, abdominal ultrasound, EKG, and lab work were unremarkable. H. pylori was negative. We again reviewed the procedure. The consent form was reviewed line by line and all her questions were answered.  Past Medical History  Diagnosis Date  . Anemia   . Joint pain   . Seasonal allergies   . Edema of both legs    Past Surgical History  Procedure Date  . Tubal ligation 1994  . Hernia repair Umbilical   Current Outpatient Prescriptions  Medication Sig Dispense Refill  . acetaminophen (TYLENOL) 500 MG tablet Take 500 mg by mouth as needed.        . ergocalciferol (VITAMIN D2) 50000 UNITS capsule Take 50,000 Units by mouth 2 (two) times a week.        . FeAsp-FeFum -Suc-C-Thre-B12-FA (MULTIGEN PLUS PO) Take by mouth 2 (two) times daily.        . fexofenadine (ALLEGRA) 180 MG tablet Take 180 mg by mouth daily.        . furosemide (LASIX) 40 MG tablet Take 40 mg by mouth daily.        . ibuprofen (ADVIL,MOTRIN) 200 MG tablet Take 200 mg by mouth as needed.        . lisdexamfetamine (VYVANSE) 70 MG capsule Take 70 mg by mouth every morning.         Allergies  Allergen Reactions  . Benadryl (Altaryl)   . Sulfa Antibiotics   . Tomato    History  Substance Use Topics  . Smoking status: Never Smoker   . Smokeless tobacco: Not on file  . Alcohol Use: No    Review of Systems  Respiratory: Negative.   Cardiovascular: Negative.   Musculoskeletal: Positive for arthralgias.       Objective:   Physical Exam Physical Exam  Gen.: Morbidly obese African American female who otherwise appears  well.  Skin warm and dry without infection  HEENT: No palpable masses or thyromegaly. Sclera nonicteric. Pupils equal round reactive. Oropharynx clear.  Lymph nodes: No cervical supraclavicular or inguinal nodes palpable  Lungs: Clear equal with no increased work of breathing  Cardiovascular: 2/6 systolic ejection murmur. Regular rate and rhythm. No edema. No JVD. Peripheral pulses intact.  Abdomen: Obese. Well-healed periumbilical incision. No tenderness. No discernible masses or organomegaly.  Extremities: No joint swelling deformity or edema  Neurologic: Alert and fully oriented. Gait normal     Assessment:     45-year-old female with progressive morbid obesity unresponsive to numerous attempts at medical management and presents at 287 pounds and BMI of 45 With comorbidities of chronic joint pain and stress urinary incontinence    Plan:     Laparoscopic Roux-en-Y gastric bypass      

## 2011-11-03 NOTE — Anesthesia Preprocedure Evaluation (Addendum)
Anesthesia Evaluation  Patient identified by MRN, date of birth, ID band Patient awake    Reviewed: Allergy & Precautions, H&P , NPO status , Patient's Chart, lab work & pertinent test results  History of Anesthesia Complications (+) PONV  Airway Mallampati: II TM Distance: >3 FB Neck ROM: Full    Dental No notable dental hx. (+) Teeth Intact and Dental Advisory Given   Pulmonary neg pulmonary ROS,  clear to auscultation  Pulmonary exam normal       Cardiovascular Exercise Tolerance: Good neg cardio ROS + Valvular Problems/Murmurs Regular Normal    Neuro/Psych  Headaches, Negative Neurological ROS  Negative Psych ROS   GI/Hepatic negative GI ROS, Neg liver ROS,   Endo/Other  Negative Endocrine ROS  Renal/GU negative Renal ROS  Genitourinary negative   Musculoskeletal   Abdominal   Peds  Hematology negative hematology ROS (+)   Anesthesia Other Findings   Reproductive/Obstetrics negative OB ROS                          Anesthesia Physical Anesthesia Plan  ASA: II  Anesthesia Plan: General   Post-op Pain Management:    Induction: Intravenous  Airway Management Planned: Oral ETT  Additional Equipment:   Intra-op Plan:   Post-operative Plan: Extubation in OR  Informed Consent: I have reviewed the patients History and Physical, chart, labs and discussed the procedure including the risks, benefits and alternatives for the proposed anesthesia with the patient or authorized representative who has indicated his/her understanding and acceptance.   Dental Advisory Given  Plan Discussed with: CRNA and Surgeon  Anesthesia Plan Comments:         Anesthesia Quick Evaluation

## 2011-11-03 NOTE — Op Note (Signed)
Preop diagnosis: Morbid obesity  Postop diagnosis: Morbid obesity  Surgical procedure: Laparoscopic Roux-en-Y gastric bypass  Surgeon: Sharlet Salina T.Zyriah Mask M.D.  Asst.: Lodema Pilot M.D.  Anesthesia: General  Complications:  None  EBL: Minimal  Drains: None  Disposition: PACU in good condition  Description of procedure: Patient is brought to the operating room and general anesthesia induced. She had received preoperative broad-spectrum IV antibiotics and subcutaneous heparin. The abdomen was widely sterilely prepped and draped. Patient timeout was performed and correct patient and procedure confirmed. Access was obtained with a 12 mm Optiview trocar in the left upper quadrant and pneumoperitoneum established without difficulty. Under direct vision 12 mm trocars were placed laterally in the right upper quadrant, right upper quadrant midclavicular line, and to the left and above the umbilicus for the camera port. A 5 mm trocar was placed laterally in the left upper quadrant. The omentum was brought into the upper abdomen and the transverse mesocolon elevated and the ligament of Treitz clearly identified. A 40 cm biliopancreatic limb was then carefully measured from the ligament of Treitz. The small intestine was divided at this point with a single firing of the white load linear stapler. A Penrose drain was sutured to the end of the Roux-en-Y limb for later identification. A 100 cm Roux-en-Y limb was then carefully measured. At this point a side-to-side anastomosis was created between the Roux limb and the end of the biliopancreatic limb. This was accomplished with a single firing of the 45 mm white load linear stapler. The common enterotomy was closed with a running 2-0 Vicryl begun at either end of the enterotomy and tied centrally. The mesenteric defect was then closed with running 2-0 silk. The omentum was then divided with the harmonic scalpel up towards the transverse colon to allow mobility  of the Roux limb toward the gastric pouch. The patient was then placed in steep reversed Trendelenburg. Through a 5 mm subxiphoid site the [facility] retractor was placed and the left lobe of the liver elevated with excellent exposure of the upper stomach and hiatus. The angle of Hiss was then mobilized with the harmonic scalpel. A 4 cm gastric pouch was then carefully measured along the lesser curve of the stomach. Dissection was carried along the lesser curve at this point with the Harmonic scalpel working carefully back toward the lesser sac at right angles to the lesser curve. The free lesser sac was then entered. After being sure all tubes were removed from the stomach an initial firing of the gold load 60 mm linear stapler was fired at right angles across the lesser curve for about 4 cm. The gastric pouch was further mobilized posteriorly and then the pouch was completed with 2 further firings of the 60 mm blue load linear stapler up through the previously dissected angle of His. It was ensured that the pouch was completely mobilized away from the gastric remnant. This created a nice tubular 4-5 cm gastric pouch. The staple line of the gastric remnant was then oversewn with 2-0 silk for hemostasis. The Roux limb was then brought up in an antecolic fashion with the candycane facing to the patient's left without undue tension. The gastrojejunostomy was created with an initial posterior row of 2-0 Vicryl between the Roux limb and the staple line of the gastric pouch. Enterotomies were then made in the gastric pouch and the Roux limb with the harmonic scalpel and at approximately 2-2-1/2 cm anastomosis was created with a single firing of the blue load linear stapler.  The staple line was inspected and was intact without bleeding. The common enterotomy was then closed with running 2-0 Vicryl begun at either end and tied centrally. The wall tube was then easily passed through the anastomosis and an outer anterior  layer of running 2-0 Vicryl was placed. The Ewald tube was removed. With the outlet of the gastrojejunostomy clamped and under saline irrigation the assistant performed upper endoscopy and with the gastric pouch tensely distended with air there was no evidence of leak. The pouch was desufflated. The Vonita Moss defect was closed with running 2-0 silk. The abdomen was inspected for any evidence of bleeding or bowel injury and everything looked fine. The Nathanson retractor was removed under direct vision after coating the anastomosis with Tisseel tissue sealant. All CO2 was evacuated and trochars removed. Skin incisions were closed with staples. Sponge needle and instrument counts were correct. The patient was taken to the PACU in good condition.     Mariella Saa MD, FACS  11/03/2011, 2:44 PM

## 2011-11-03 NOTE — Transfer of Care (Signed)
Immediate Anesthesia Transfer of Care Note  Patient: Gail Gentry  Procedure(s) Performed:  LAPAROSCOPIC ROUX-EN-Y GASTRIC - with upper endoscopy  Patient Location: PACU  Anesthesia Type: General  Level of Consciousness: awake  Airway & Oxygen Therapy: Patient Spontanous Breathing  Post-op Assessment: Report given to PACU RN and Post -op Vital signs reviewed and stable  Post vital signs: Reviewed and stable  Complications: No apparent anesthesia complications

## 2011-11-03 NOTE — Anesthesia Postprocedure Evaluation (Signed)
  Anesthesia Post-op Note  Patient: Gail Gentry  Procedure(s) Performed:  LAPAROSCOPIC ROUX-EN-Y GASTRIC - with upper endoscopy  Patient Location: PACU  Anesthesia Type: General  Level of Consciousness: awake and alert   Airway and Oxygen Therapy: Patient Spontanous Breathing  Post-op Pain: mild  Post-op Assessment: Post-op Vital signs reviewed, Patient's Cardiovascular Status Stable, Respiratory Function Stable, Patent Airway and No signs of Nausea or vomiting  Post-op Vital Signs: stable  Complications: No apparent anesthesia complications

## 2011-11-03 NOTE — Interval H&P Note (Signed)
History and Physical Interval Note:  11/03/2011 11:12 AM  Gail Gentry  has presented today for surgery, with the diagnosis of morbid obesity  The various methods of treatment have been discussed with the patient and family. After consideration of risks, benefits and other options for treatment, the patient has consented to  Procedure(s): LAPAROSCOPIC ROUX-EN-Y GASTRIC as a surgical intervention .  The patients' history has been reviewed, patient examined, no change in status, stable for surgery.  I have reviewed the patients' chart and labs.  Questions were answered to the patient's satisfaction.     Deunte Bledsoe T

## 2011-11-04 ENCOUNTER — Inpatient Hospital Stay (HOSPITAL_COMMUNITY): Payer: 59

## 2011-11-04 LAB — DIFFERENTIAL
Basophils Relative: 0 % (ref 0–1)
Eosinophils Absolute: 0 10*3/uL (ref 0.0–0.7)
Eosinophils Relative: 0 % (ref 0–5)
Monocytes Absolute: 0.8 10*3/uL (ref 0.1–1.0)
Neutro Abs: 13.4 10*3/uL — ABNORMAL HIGH (ref 1.7–7.7)
Neutrophils Relative %: 89 % — ABNORMAL HIGH (ref 43–77)

## 2011-11-04 LAB — CBC
HCT: 35.4 % — ABNORMAL LOW (ref 36.0–46.0)
Hemoglobin: 12 g/dL (ref 12.0–15.0)
MCH: 28 pg (ref 26.0–34.0)
MCHC: 33.9 g/dL (ref 30.0–36.0)
MCV: 82.5 fL (ref 78.0–100.0)
RBC: 4.29 MIL/uL (ref 3.87–5.11)

## 2011-11-04 MED ORDER — PROMETHAZINE HCL 25 MG/ML IJ SOLN: 12.5000 mg | INTRAMUSCULAR | Status: DC | PRN

## 2011-11-04 NOTE — Progress Notes (Signed)
Pt complaining of sore throat this morning. She said "it feels like my lymph node is swollen". Palpated pt's throat and noted that the lymph node on the right side of the neck is swollen. Pt said that she felt more sore where the swollen lymph node is rather than on the inside of her throat.  Will continue to monitor.  Samara Snide [facility] 11/04/2011 6:31 AM

## 2011-11-04 NOTE — Progress Notes (Signed)
Pt alert and oriented; VSS; pt c/o gas pain, unable to pass gas; encouraged ambulation and discussed with RN possibility of getting order for Kpad; remains NPO until after swallow study; doppler study complete; denies nausea or vomiting; voiding without difficulty; ambulating well; encouraged pulmonary toilet and pt and SOP verbalized understanding of; discharge instructions given to pt to review and will complete tomorrow.   Talmadge Chad, RN Bariatric Nurse Coordinator

## 2011-11-04 NOTE — Progress Notes (Signed)
Bilateral lower extremity venous duplex completed at 08:58.  Preliminary report is negative for DVT, SVT, or a Baker's cyst. Gail Gentry 11/04/2011, 9:40 AM

## 2011-11-05 ENCOUNTER — Encounter (HOSPITAL_COMMUNITY): Payer: Self-pay | Admitting: General Surgery

## 2011-11-05 LAB — DIFFERENTIAL
Basophils Relative: 0 % (ref 0–1)
Eosinophils Relative: 0 % (ref 0–5)
Lymphocytes Relative: 15 % (ref 12–46)
Monocytes Absolute: 0.8 10*3/uL (ref 0.1–1.0)
Monocytes Relative: 8 % (ref 3–12)
Neutro Abs: 7.6 10*3/uL (ref 1.7–7.7)

## 2011-11-05 LAB — CBC
HCT: 34.8 % — ABNORMAL LOW (ref 36.0–46.0)
Hemoglobin: 11.3 g/dL — ABNORMAL LOW (ref 12.0–15.0)
MCHC: 32.5 g/dL (ref 30.0–36.0)
MCV: 83.1 fL (ref 78.0–100.0)
RDW: 14 % (ref 11.5–15.5)

## 2011-11-05 MED ORDER — OXYCODONE-ACETAMINOPHEN 5-325 MG/5ML PO SOLN: 5.0000 mL | ORAL | Status: AC | PRN

## 2011-11-05 NOTE — Progress Notes (Signed)
Pt alert and oriented; VSS; mother at bedside; pt denies nausea or vomiting; tolerating water well; will advance to protein shake and if tolerates well, will discharge per MD order; ambulating and voiding without difficulty; pt verbalized able to pass gas today and feels better; pt already has follow up appts with [facility] and [facility]; discharge instructions reviewed and pt and mother verbalized understanding. GASTRIC BYPASS/SLEEVE DISCHARGE INSTRUCTIONS  Drs. Fredrik Rigger, Hoxworth, Wilson, and Delton Call if you have any problems.   Call [phone] and ask for the surgeon on call.    If you need immediate assistance come to the ER at [facility]. Tell the ER personnel that you are a new post-op gastric bypass patient. Signs and symptoms to report:   Severe vomiting or nausea. If you cannot tolerate clear liquids for longer than 1 day, you need to call your surgeon.    Abdominal pain which does not get better after taking your pain medication   Fever greater than 101 F degree   Difficulty breathing   Chest pain    Redness, swelling, drainage, or foul odor at incision sites    If your incisions open or pull apart   Swelling or pain in calf (lower leg)   Diarrhea, frequent watery, uncontrolled bowel movements.   Constipation, (no bowel movements for 3 days) if this occurs, Take Milk of Magnesia, 2 tablespoons by mouth, 3 times a day for 2 days if needed.  Call your doctor if constipation continues. Stop taking Milk of Magnesia once you have had a bowel movement. You may also use Miralax according to the label instructions.   Anything you consider "abnormal for you".   Normal side effects after Surgery:   Unable to sleep at night or concentrate   Irritability   Being tearful (crying) or depressed   These are common complaints, possibly related to your anesthesia, stress of surgery and change in lifestyle, that usually go away a few weeks after surgery.  If these feelings continue, call your  medical doctor.  Wound Care You may have surgical glue, steri-strips, or staples over your incisions after surgery.  Surgical glue:  Looks like a clear film over your incisions and will wear off gradually. Steri-strips: Strips of tape over your incisions. You may notice a yellowish color on the skin underneath the steri-strips. This is a substance used to make the steri-strips stick better. Do not pull the steri-strips off - let them fall off.  Staples: Cherlynn Polo may be removed before you leave the hospital. If you go home with staples, call [facility] [phone]) for an appointment with your surgeon's nurse to have staples removed in 7 - 10 days. Showering: You may shower two days after your surgery unless otherwise instructed by your surgeon. Wash gently around wounds with warm soapy water, rinse well, and gently pat dry.  If you have a drain, you may need someone to hold this while you shower. Avoid tub baths until staples are removed and incisions are healed.    Medications   Medications should be liquid or crushed if larger than the size of a dime.  Extended release pills should not be crushed.   Depending on the size and number of medications you take, you may need to stagger/change the time you take your medications so that you do not over-fill your pouch.    Make sure you follow-up with your primary care physician to make medication adjustments needed during rapid weight loss and life-style adjustment.  If you are diabetic, follow up with the doctor that prescribes your diabetes medication(s) within one week after surgery and check your blood sugar regularly.   Do not drive while taking narcotics!   Do not take acetaminophen (Tylenol) and Roxicet or Lortab Elixir at the same time since these pain medications contain acetaminophen.  Diet at home: (First 2 Weeks) You will see the nutritionist two weeks after your surgery. She will advance your diet if you are tolerating  liquids well. Once at home, if you have severe vomiting or nausea and cannot tolerate clear liquids lasting longer than 1 day, call your surgeon.  Begin high protein shake 2 ounces every 3 hours, 5 - 6 times per day.  Gradually increase the amount you drink as tolerated.  You may find it easier to slowly sip shakes throughout the day.  It is important to get your proteins in first.   Protein Shake   Drink at least 2 ounces of shake 5-6 times per day   Each serving of protein shakes should have a minimum of 15 grams of protein and no more than 5 grams of carbohydrate    Increase the amount of protein shake you drink as tolerated   Protein powder may be added to fluids such as non-fat milk or Lactaid milk (limit to 20 grams added protein powder per serving   The initial goal is to drink at least 8 ounces of protein shake/drink per day (or as directed by the nutritionist). Some examples of protein shakes are [employer], [employer], EAS Edge HP, and [employer]jury. Hydration   Gradually increase the amount of water and other liquids as tolerated (See Acceptable Fluids)   Gradually increase the amount of protein shake as tolerated     Sip fluids slowly and throughout the day   May use Sugar substitutes, use sparingly (limit to 6 - 8 packets per day). Your fluid goal is 64 ounces of fluid daily. It may take a few weeks to build up to this.         32 oz (or more) should be clear liquids and 32 oz (or more) should be full liquids.         Liquids should not contain sugar, caffeine, or carbonation! Acceptable Fluids Clear Liquids:   Water or Sugar-free flavored water, Fruit H2O   Decaffeinated coffee or tea (sugar-free)   Crystal Lite, Wyler's Lite, Minute Maid Lite   Sugar-free Jell-O   Bouillon or broth   Sugar-free Popsicle:   *Less than 20 calories each; Limit 1 per day   Full Liquids:              Protein Shakes/Drinks + 2 choices per day of other full liquids shown below.    Other full  liquids must be: No more than 12 grams of Carbs per serving,  No more than 3 grams of Fat per serving   Strained low-fat cream soup   Non-Fat milk   Fat-free Lactaid Milk   Sugar-free yogurt (Dannon Lite & Fit) Vitamins and Minerals (Start 1 day after surgery unless otherwise directed)   2 Chewable Multivitamin / Multimineral Supplement (i.e. Centrum for Adults)   Chewable Calcium Citrate with Vitamin D-3. Take 1500 mg each day.           (Example: 3 Chewable Calcium Plus 600 with Vitamin D-3 can be found at [facility])         Vitamin B-12, 350 - 500 micrograms (oral tablet) each day  Do not mix multivitamins containing iron with calcium supplements; take 2 hours   apart   Do not substitute Tums (calcium carbonate) for your calcium   Menstruating women and those at risk for anemia may need extra iron. Talk with your doctor to see if you need additional iron.    If you need extra iron:  Total daily Iron recommendations (including Vitamins) = 50 - 100 mg Iron/day Do not stop taking or change any vitamins or minerals until you talk to your nutritionist or surgeon. Your nutritionist and / or physician must approve all vitamin and mineral supplements. Exercise For maximum success, begin exercising as soon as your doctor recommends. Make sure your physician approves any physical activity.   Depending on fitness level, begin with a simple walking program   Walk 5-15 minutes each day, 7 days per week.    Slowly increase until you are walking 30-45 minutes per day   Consider joining our BELT program. [phone] or email [email or web address] [email or web address] Things to remember:    You may have sexual relations when you feel comfortable. It is VERY important for female patients to use a reliable birth control method. Fertility often increases after surgery. Do not get pregnant for at least 18 months.   It is very important to keep all follow up appointments with your surgeon, nutritionist, primary care physician, and  behavioral health practitioner. After the first year, please follow up with your bariatric surgeon at least once a year in order to maintain best weight loss results.  [facility]: [phone] [facility]: [phone]   Free counseling is available for you and your family through collaboration between [facility] and [place]. Please call [phone] and leave a message.    Consider purchasing a medical alert bracelet that says you had gastric bypass surgery.    [facility] has a free Bariatric Surgery Support Group that meets monthly, the 3rd Thursday, 6 pm, Classroom #1, [place]. You may register online at [email or web address], but registration is not necessary. Select Classes and Support Groups, Bariatric Surgery, or Call [phone]   Do not return to work or drive until cleared by your surgeon   Use your CPAP when sleeping if applicable   Do not lift anything greater than ten pounds for at least two weeks  Talmadge Chad, RN Bariatric Nurse coordinator

## 2011-11-05 NOTE — Discharge Summary (Signed)
   Patient ID: Gail Gentry [id number] 45 y.o. [birth year]  11/03/2011  Discharge date and time: 11/05/2011   Admitting Physician: Glenna Fellows T  Discharge Physician: Glenna Fellows T  Admission Diagnoses: morbid obesity  Discharge Diagnoses: Same  Operations: Procedure(s): LAPAROSCOPIC ROUX-EN-Y GASTRIC  Admission Condition: good  Discharged Condition: good   Hospital Course: Pt admitted electively for gastric bypass.  No complecations. _ swallow study OK.  Ready for discharge POD#    Disposition: Home  Patient Instructions:   Gail Gentry, [name]  Home Medication Instructions WUJ:[id number]   Printed on:11/05/11 0913  Medication Information                    lisdexamfetamine (VYVANSE) 70 MG capsule Take 70 mg by mouth every morning.            FeAsp-FeFum -Suc-C-Thre-B12-FA (MULTIGEN PLUS PO) Take 1 tablet by mouth 2 (two) times daily.            fexofenadine (ALLEGRA) 180 MG tablet Take 180 mg by mouth every morning.            acetaminophen (TYLENOL) 500 MG tablet Take 1,000 mg by mouth every 4 (four) hours as needed. For pain           Cholecalciferol (VITAMIN D-3) 5000 UNITS TABS Take 1 tablet by mouth daily.            oxyCODONE-acetaminophen (ROXICET) 5-325 MG/5ML solution Take 5-10 mLs by mouth every 4 (four) hours as needed.             Activity: no heavy lifting for 3 weeks Diet: protein shakes Wound Care: none needed  Follow-up:  With Kamerin Axford   Signed: Mariella Saa MD, FACS  11/05/2011, 9:13 AM

## 2011-11-05 NOTE — Progress Notes (Signed)
Patient given discharge instructions and prescription. Verbalized understanding of instructions . Bariatric nurse saw patient. Pain controlled by oral analgesic. Tolerated diet shake. No nausea. Iv site removed, catheter tip intact. Left unit in wheelchair accompanied by staff and family.

## 2011-11-05 NOTE — Progress Notes (Signed)
Patient ID: Gail Gentry, female   DOB: 07-Feb-1966, 45 y.o.   MRN: [id number] 2 Days Post-Op  Subjective: Feels well, no C/O.  Tol water po  Objective: Vital signs in last 24 hours: Temp:  [98.4 F (36.9 C)-99.3 F (37.4 C)] 98.9 F (37.2 C) (12/19 0639) Pulse Rate:  [61-72] 67  (12/19 0639) Resp:  [18-20] 18  (12/19 0639) BP: (132-154)/(78-93) 137/78 mmHg (12/19 0639) SpO2:  [96 %-98 %] 97 % (12/19 0639) Last BM Date: 11/02/11  Intake/Output from previous day: 12/18 0701 - 12/19 0700 In: 1981 [P.O.:300; I.V.:1681] Out: 1850 [Urine:1850] Intake/Output this shift:    General appearance: alert and no distress GI: soft, non-tender; bowel sounds normal; no masses,  no organomegaly Incision/Wound:Clean and dry  Lab Results:   Basename 11/05/11 0349 11/04/11 0400  WBC 9.9 15.1*  HGB 11.3* 12.0  HCT 34.8* 35.4*  PLT 231 285   BMET No results found for this basename: NA:2,K:2,CL:2,CO2:2,GLUCOSE:2,BUN:2,CREATININE:2,CALCIUM:2 in the last 72 hours PT/INR No results found for this basename: LABPROT:2,INR:2 in the last 72 hours ABG No results found for this basename: PHART:2,PCO2:2,PO2:2,HCO3:2 in the last 72 hours  Studies/Results: Dg Ugi W/water Sol Cm  11/04/2011  *RADIOLOGY REPORT*  Clinical Data:Postop day #1 from gastric bypass surgery  WATER SOLUBLE UPPER GI SERIES  Technique:  Single-column upper GI series was performed using 50cc water soluble contrast.  Fluoroscopy Time: 1.6 minutes  Comparison: None.  Findings: The scout radiograph shows mild gaseous distention of small bowel, likely representing a mild postop ileus.  Upper GI series shows no evidence of esophageal mass or stricture. There is no evidence of hiatal hernia.  A small gastric pouch is noted, which is normal in appearance.  There is prompt passage of contrast into the efferent small bowel, which is nondilated.  No evidence of contrast leak or extravasation.  The efferent small bowel is normal in  appearance.  No significant contrast filling of afferent loop noted.  IMPRESSION: Normal postop appearance status post gastric bypass surgery.  No evidence of contrast leak or obstruction.  Original Report Authenticated By: Danae Orleans, M.D.    Anti-infectives: Anti-infectives     Start     Dose/Rate Route Frequency Ordered Stop   11/03/11 1145   cefOXitin (MEFOXIN) 2 g in dextrose 5 % 50 mL IVPB        2 g 100 mL/hr over 30 Minutes Intravenous  Once 11/03/11 1119 11/03/11 1145   11/03/11 0945   cefOXitin (MEFOXIN) 2 g in dextrose 5 % 50 mL IVPB  Status:  Discontinued        2 g 100 mL/hr over 30 Minutes Intravenous  Once 11/03/11 4098 11/03/11 1058          Assessment/Plan: s/p Procedure(s): LAPAROSCOPIC ROUX-EN-Y GASTRIC Doing well.  Advance to protein shakes and home if tolerated   LOS: 2 days    Jla Reynolds T 11/05/2011

## 2011-11-20 ENCOUNTER — Ambulatory Visit: Payer: 59 | Admitting: *Deleted

## 2011-11-21 ENCOUNTER — Encounter (INDEPENDENT_AMBULATORY_CARE_PROVIDER_SITE_OTHER): Payer: Self-pay | Admitting: General Surgery

## 2011-11-21 ENCOUNTER — Ambulatory Visit (INDEPENDENT_AMBULATORY_CARE_PROVIDER_SITE_OTHER): Payer: Commercial Managed Care - PPO | Admitting: General Surgery

## 2011-11-21 VITALS — BP 116/80 | HR 90 | Temp 98.1°F | Ht 67.0 in | Wt 274.4 lb

## 2011-11-21 DIAGNOSIS — E669 Obesity, unspecified: Secondary | ICD-10-CM

## 2011-11-21 NOTE — Progress Notes (Signed)
Patient returns to the office just over 2 weeks following laparoscopic Roux-en-Y gastric bypass. She is thrilled with how she is feeling. She has not really had any pain or nausea. She is filling up very quickly with small meals and no hunger. Her energy level is good and she is ready to go back to work.  Exam: Weight is down to 14 pounds from preop of 288 current 274. Gen.: Appears well Abdomen: Soft nontender and wounds are well healed  Assessment and plan: Doing very well following laparoscopic Roux-en-Y gastric bypass with no complications identified. She is released to return to work next week.

## 2011-11-24 ENCOUNTER — Encounter: Payer: 59 | Admitting: *Deleted

## 2011-11-25 ENCOUNTER — Encounter: Payer: 59 | Attending: General Surgery | Admitting: *Deleted

## 2011-11-25 ENCOUNTER — Encounter: Payer: Self-pay | Admitting: *Deleted

## 2011-11-25 DIAGNOSIS — Z9884 Bariatric surgery status: Secondary | ICD-10-CM | POA: Insufficient documentation

## 2011-11-25 DIAGNOSIS — Z01818 Encounter for other preprocedural examination: Secondary | ICD-10-CM | POA: Insufficient documentation

## 2011-11-25 DIAGNOSIS — Z713 Dietary counseling and surveillance: Secondary | ICD-10-CM | POA: Insufficient documentation

## 2011-11-25 NOTE — Progress Notes (Signed)
  Follow-up visit: 3 Weeks Post-Operative Gastric Bypass Surgery  Medical Nutrition Therapy:  Appt start time: 1710 end time:  1739.  Assessment:  Primary concerns today: post-operative bariatric surgery nutrition management. Pt missed her appointment last week for post-op nutrition class after surgery.  Surgery date: 12/17  Surgery type: Gastric Bypass  Start wt @ [facility]: 286.6 lbs  Weight today: 275.8 lbs Weight change: 28.9 lbs Total weight lost: 28.9 lbs total BMI: 43.3%  24-hr recall: Pt reports that she has been eating all types of food including hot cereals, mashed potatoes, noodles, meats, and greens. She notes multiple episodes of stomach upset and notes that she think is eating too much.  Fluid intake: water, protein shakes = 64 oz Estimated total protein intake: 30-40g  Medications: No changes at this point Supplementation: Taking regularly; no problems reported  Using straws: No Drinking while eating: No Hair loss: No Carbonated beverages: No N/V/D/C: Some regurgitation due to eating touch textures too early Dumping syndrome: None at this point  Recent physical activity:  Exercise video (1-5 mile walking video - 30 minutes)  Progress Towards Goal(s):  In progress.  Handouts given during visit include:  Phase 3A - High Protein Phase   Nutritional Diagnosis:  NI-5.7.1 Inadequate protein intake As related to limited intake and incorporation of foods without significant protein sources.  As evidenced by pt meeting <50% of protein goal.    Intervention:  Nutrition education.  Monitoring/Evaluation:  Dietary intake, exercise, lap band fills, and body weight. Follow up in 6 weeks for 2 month post-op visit.

## 2011-11-25 NOTE — Patient Instructions (Addendum)
Follow Phase 3A-Soft, High Protein Diet and follow-up at [facility] in 6 weeks for 2 months post-op nutrition visit for diet advancement.  

## 2011-12-09 ENCOUNTER — Telehealth (INDEPENDENT_AMBULATORY_CARE_PROVIDER_SITE_OTHER): Payer: Self-pay

## 2011-12-09 NOTE — Telephone Encounter (Addendum)
Patient called seeking advice on what pain medicine she can take for sever menses cramps.  On 11/03/2011 she had a RNY by Dr. Johna Sheriff.  --per Dr. Donell Beers patient may take up to 1000 mgs of tylenol per day.  Patient also advised to call primary care provider.

## 2012-01-06 ENCOUNTER — Encounter: Payer: 59 | Attending: General Surgery | Admitting: *Deleted

## 2012-01-06 DIAGNOSIS — Z713 Dietary counseling and surveillance: Secondary | ICD-10-CM | POA: Insufficient documentation

## 2012-01-06 DIAGNOSIS — Z01818 Encounter for other preprocedural examination: Secondary | ICD-10-CM | POA: Insufficient documentation

## 2012-01-06 DIAGNOSIS — Z9884 Bariatric surgery status: Secondary | ICD-10-CM | POA: Insufficient documentation

## 2012-01-06 NOTE — Progress Notes (Addendum)
Follow-up visit:  8 Weeks Post-Operative Gastric Bypass Surgery  Medical Nutrition Therapy:  Appt start time: 1730 end time:  1830.  Primary concerns today: Post-operative bariatric surgery nutrition management.  Pt here for 8 week follow up. 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 Discussed bringing single doses in small tupperware container. Also reports not taking iron d/t sx of itchiness. Is working with MD for replacement.  TANITA  BODY COMP RESULTS (Baseline) %Fat: 49.8% FM: 130.5 lbs FFM: 132.0 lbs TBW: 96.5 lbs  Surgery date: 12/17  Surgery type: Gastric Bypass  Start wt @ [facility]: 286.6 lbs  Weight today: 264.1 lbs Weight change: 11.7 lbs Total weight lost: 40.6 lbs total BMI: 41.3%  24-hr recall: B: egg white omlette, small amt of bacon S (drinks over 2 hrs: 8-10am):  Protein shake (16 oz) L: flank steak (3 oz) D: mixed greens, black eyed peas (1/4-1/2 cup)  Fluid intake: water, protein shakes = ~64 oz Estimated total protein intake: 70-80g  Medications: Not taking Vit D, Allegra, Tylenol,  Supplementation: Taking all regularly except iron (causes itching) and calcium (only BID; see above). Using straws: Yes (no problems reported) Drinking while eating: No Hair loss: No Carbonated beverages: No ETOH: No N/V/D/C: Vomiting x1 Dumping syndrome:  x1 (after eating 2 bites of pizza)  Recent physical activity: Continues exercise video (1-5 mile walking video - 30 minutes)  Progress Towards Goal(s):  In progress.  Handouts given during visit include:  Phase 3B - High Protein + Non-Starchy Vegetables  [place] handout   Nutritional Diagnosis:   [name]-3.3 Overweight/obesity As related to recent bariatric surgery. As evidenced by pt following post-operative nutrition guidelines for continued weight loss.  Intervention:  Nutrition education.  Monitoring/Evaluation:  Dietary intake, exercise, and  body weight. Follow up in 4 weeks for 3 month post-op visit.

## 2012-01-07 ENCOUNTER — Encounter: Payer: Self-pay | Admitting: *Deleted

## 2012-01-08 ENCOUNTER — Encounter: Payer: Self-pay | Admitting: *Deleted

## 2012-01-08 NOTE — Patient Instructions (Signed)
Follow Phase 3B-Soft, High Protein+Non-Starchy Vegetables Diet and follow-up at [facility] in 4 weeks for 3 months post-op nutrition visit for diet advancement.

## 2012-01-30 ENCOUNTER — Ambulatory Visit (INDEPENDENT_AMBULATORY_CARE_PROVIDER_SITE_OTHER): Payer: Commercial Managed Care - PPO | Admitting: General Surgery

## 2012-01-30 ENCOUNTER — Other Ambulatory Visit (INDEPENDENT_AMBULATORY_CARE_PROVIDER_SITE_OTHER): Payer: Self-pay

## 2012-01-30 VITALS — BP 124/80 | HR 78 | Temp 97.2°F | Resp 16 | Ht 67.0 in | Wt 254.0 lb

## 2012-01-30 DIAGNOSIS — D849 Immunodeficiency, unspecified: Secondary | ICD-10-CM

## 2012-01-30 DIAGNOSIS — E669 Obesity, unspecified: Secondary | ICD-10-CM

## 2012-01-30 LAB — CBC WITH DIFFERENTIAL/PLATELET
Basophils Absolute: 0 10*3/uL (ref 0.0–0.1)
Basophils Relative: 0 % (ref 0–1)
Eosinophils Absolute: 0.1 10*3/uL (ref 0.0–0.7)
HCT: 36.7 % (ref 36.0–46.0)
MCH: 26.7 pg (ref 26.0–34.0)
MCHC: 31.6 g/dL (ref 30.0–36.0)
Monocytes Absolute: 0.5 10*3/uL (ref 0.1–1.0)
Neutro Abs: 3.4 10*3/uL (ref 1.7–7.7)
RDW: 14.8 % (ref 11.5–15.5)

## 2012-01-30 LAB — IRON AND TIBC
%SAT: 22 % (ref 20–55)
Iron: 61 ug/dL (ref 42–145)
TIBC: 275 ug/dL (ref 250–470)
UIBC: 214 ug/dL (ref 125–400)

## 2012-01-30 LAB — COMPREHENSIVE METABOLIC PANEL
AST: 14 U/L (ref 0–37)
Albumin: 4.3 g/dL (ref 3.5–5.2)
Alkaline Phosphatase: 65 U/L (ref 39–117)
BUN: 10 mg/dL (ref 6–23)
Creat: 0.68 mg/dL (ref 0.50–1.10)
Potassium: 3.6 mEq/L (ref 3.5–5.3)
Total Bilirubin: 0.6 mg/dL (ref 0.3–1.2)

## 2012-01-30 LAB — MAGNESIUM: Magnesium: 2.1 mg/dL (ref 1.5–2.5)

## 2012-01-30 LAB — VITAMIN B12: Vitamin B-12: 450 pg/mL (ref 211–911)

## 2012-01-30 NOTE — Progress Notes (Signed)
Chief complaint: Follow gastric bypass. Recent nausea and abdominal pain.  History: Patient returns for her routine scheduled followup 3 months following laparoscopic Roux-en-Y gastric bypass. She generally had been doing very well until about a week ago. She had been tolerating her solid protein diet although it had expected minimal appetite. She has had some issues getting enough fluids in and feels that she is occasionally dehydrated with concentrated urine. However her husband developed an acute illness with some fever URI symptoms and diarrhea a couple days later she got sick with similar symptoms. That was about a week ago. She has continued to have some nausea and general feeling of stomach upset. When she eats she gets some mid abdominal pain and nausea but no vomiting. She had some intermittent fever. She saw her PCP and has been prescribed Phenergan and Nexium. She has infrequent bowel movements which is her normal. She does chronically have some gas and bloating after eating since her bypass.  Exam:  Gen. does not appear acutely ill Skin: Warm and dry no rash or infection Lungs: Clear that we sugars were draining Abdomen: Her abdomen is moderately tender diffusely but somewhat more in the right upper quadrant and right lower quadrant. Incisions well healed. No hernias.  Assessment plan: 3 months following Roux-en-Y gastric bypass. Overall she's doing very well with excellent weight loss and no complications identified. She has an acute illness of one week duration is consistent with a viral syndrome. However her abdomen is fairly tender. It is a little earlier course to be worried about gallbladder but she is tender on the right side of going to get a gallbladder ultrasound. We will obtain complete lab work which we would do at 3 months anyway in color with these results. I agree with using the Nexium and Mylanta as needed. I told her if this improves over the next week or 2 I will see her back  for routine followup in 3 months. If however she feels worse at any point or is not better in 2 weeks she will call I would consider endoscopy to rule out marginal ulcer or stenosis.

## 2012-02-04 ENCOUNTER — Ambulatory Visit: Payer: 59 | Admitting: *Deleted

## 2012-02-04 ENCOUNTER — Other Ambulatory Visit (INDEPENDENT_AMBULATORY_CARE_PROVIDER_SITE_OTHER): Payer: Self-pay

## 2012-02-04 ENCOUNTER — Telehealth (INDEPENDENT_AMBULATORY_CARE_PROVIDER_SITE_OTHER): Payer: Self-pay

## 2012-02-04 DIAGNOSIS — R1011 Right upper quadrant pain: Secondary | ICD-10-CM

## 2012-02-04 NOTE — Progress Notes (Signed)
Patient given lab results. 

## 2012-02-04 NOTE — Telephone Encounter (Signed)
Patient aware of appointment for u/s abd to evaluate gallbladder 02/10/12 @ 8:15, [facility] [address] NPO after midnight.

## 2012-02-10 ENCOUNTER — Ambulatory Visit
Admission: RE | Admit: 2012-02-10 | Discharge: 2012-02-10 | Disposition: A | Discharge status: Home / Self Care | Payer: Self-pay | Source: Ambulatory Visit | Attending: General Surgery | Admitting: General Surgery

## 2012-02-10 DIAGNOSIS — R1011 Right upper quadrant pain: Secondary | ICD-10-CM

## 2012-02-11 ENCOUNTER — Telehealth (INDEPENDENT_AMBULATORY_CARE_PROVIDER_SITE_OTHER): Payer: Self-pay

## 2012-02-11 NOTE — Telephone Encounter (Signed)
Left message for patient to call our office re: abd u/s results.

## 2012-02-12 ENCOUNTER — Telehealth (INDEPENDENT_AMBULATORY_CARE_PROVIDER_SITE_OTHER): Payer: Self-pay

## 2012-02-12 NOTE — Telephone Encounter (Signed)
2nd attempt to contact patient, Left voice message for patient to return my call.

## 2012-02-12 NOTE — Progress Notes (Signed)
Patient aware of u/s results, she has felt better but, will see how she feel's over the weekend and call our office next week if her symptoms return for an appointment with Dr. Johna Sheriff.

## 2012-02-25 ENCOUNTER — Encounter: Payer: 59 | Attending: General Surgery | Admitting: *Deleted

## 2012-02-25 ENCOUNTER — Encounter: Payer: Self-pay | Admitting: *Deleted

## 2012-02-25 VITALS — Ht 67.0 in | Wt 243.5 lb

## 2012-02-25 DIAGNOSIS — E669 Obesity, unspecified: Secondary | ICD-10-CM

## 2012-02-25 DIAGNOSIS — Z713 Dietary counseling and surveillance: Secondary | ICD-10-CM | POA: Insufficient documentation

## 2012-02-25 DIAGNOSIS — Z9884 Bariatric surgery status: Secondary | ICD-10-CM | POA: Insufficient documentation

## 2012-02-25 DIAGNOSIS — Z01818 Encounter for other preprocedural examination: Secondary | ICD-10-CM | POA: Insufficient documentation

## 2012-02-25 NOTE — Patient Instructions (Signed)
Goals:  Continue to follow Phase 3B: High Protein + Non-Starchy Vegetables  Eat 3-6 small meals/snacks, every 3-5 hrs  Continue fluid intake of 64oz + (may resume protein shakes for short term d/t gallstones)  Avoid drinking 15 minutes before, during and 30 minutes after eating  Aim for >30 min of physical activity daily

## 2012-02-25 NOTE — Progress Notes (Addendum)
Follow-up visit:  4 Months Post-Operative Gastric Bypass Surgery  Medical Nutrition Therapy:  Appt start time: 1730 end time:  1800.  Primary concerns today: Post-operative bariatric surgery nutrition management.  Gail Gentry is here today for follow up. Reports cramping and sweating after eating some pineapple with her Vyvanse. Cannot do broccoli d/t gas; protein shakes are ok. States she is craving meat. Reports recent diagnosis of gallstones and sludge by Dr. Johna Sheriff. Drinks 64 oz of water plus 20 oz of other low sugar/SF fluids and has not urinated all day.   TANITA  BODY COMP RESULTS  01/06/12 02/25/12    %Fat 49.8% 48.5%    FM (lbs) 130.5 118.0    FFM (lbs) 132.0 125.5    TBW (lbs) 95.5 92.0     Surgery date: 11/03/11  Surgery type: Gastric Bypass  Start wt @ [facility]: 286.6 lbs  Weight today: 243.5 lbs Weight change: - 11.7 lbs Total weight lost: 61.2 lbs total BMI: 38.1% Goal weight: 150-160 lbs % Goal Met: 45%   Partial 24-hr recall: B: 1/2 cup grits, 2 slices bacon, cheese OR bites of omlette w/ egg whites, bacon, cheese, green peppers S: Protein shake (16 oz) *Pt states she is unable to eat much d/t pain from gallstones.  Fluid intake: water, protein shakes =  65-80 oz Estimated total protein intake: 70-80g  Medications: Resuming Allegra; MD has changed Fe to new one that does not cause itching. Does not remember name or dosage. Supplementation: Taking regularly with no problems reported. Has resumed calcium. Using straws: Yes (no problems reported); makes her "slow down when drinking" Drinking while eating: No Hair loss: No Carbonated beverages: No ETOH: No N/V/D/C: Vomiting from pepto bismol, ginger ale; Constipation (takes miralax TID) Dumping syndrome: None reported  Recent physical activity: 5 miles/day on elliptical; continues exercise videos several days/week (30-45 circuit training/cardio)  Progress Towards Goal(s):  In progress.   Nutritional Diagnosis:    [place]-3.3 Overweight/obesity related to recent bariatric surgery as evidenced by pt following post-operative nutrition guidelines for continued weight loss.  Intervention:  Nutrition education.  Samples Dispensed:   Celebrate MVI (pineapple-strawberry): 2 ea Lot # [id number]; Exp: 09/14  Unjury 1)  Chicken Soup - 3 pkts  (Lot # [id number]; Exp: 01/14) 2)  Strawberry Sorbet - 1 pkt  (Lot # [id number]; Exp: 05/14) 3)  Vanilla - 1 pkt  (Lot # [id number]; Exp: 08/14)  Monitoring/Evaluation:  Dietary intake, exercise, and body weight. Follow up in 3 months for 6 month post-op visit or prn.

## 2012-02-26 ENCOUNTER — Telehealth (INDEPENDENT_AMBULATORY_CARE_PROVIDER_SITE_OTHER): Payer: Self-pay

## 2012-02-26 NOTE — Telephone Encounter (Addendum)
Gail Gentry called stating her symptoms of RUQ pain and nausea have returned, she would like to move forward with treatment plan.  I will review with Dr. Johna Sheriff and call Ms. Gherardi back @ [phone].

## 2012-03-01 ENCOUNTER — Ambulatory Visit (HOSPITAL_COMMUNITY)
Admission: RE | Admit: 2012-03-01 | Discharge: 2012-03-01 | Disposition: A | Discharge status: Home / Self Care | Payer: 59 | Source: Ambulatory Visit | Attending: Surgery | Admitting: Surgery

## 2012-03-01 ENCOUNTER — Encounter (HOSPITAL_COMMUNITY)
Admission: RE | Disposition: A | Discharge status: Home / Self Care | Payer: Self-pay | Source: Ambulatory Visit | Attending: Surgery

## 2012-03-01 ENCOUNTER — Telehealth (INDEPENDENT_AMBULATORY_CARE_PROVIDER_SITE_OTHER): Payer: Self-pay | Admitting: General Surgery

## 2012-03-01 SURGERY — EGD (ESOPHAGOGASTRODUODENOSCOPY)
Anesthesia: Moderate Sedation

## 2012-03-01 MED ORDER — DEXTROSE 5 % IV SOLN
2.0000 g | INTRAVENOUS | Status: DC
  Filled 2012-03-01: qty 2

## 2012-03-01 MED ORDER — SODIUM CHLORIDE 0.9 % IV SOLN: Freq: Once | INTRAVENOUS | Status: DC

## 2012-03-01 NOTE — Progress Notes (Signed)
Pt ate breakfast.  MD notified and case cancelled.  Pt instructed to contact MD office to reschedule.  She states that no one from MD's office told her not to eat before her appointment.

## 2012-03-02 ENCOUNTER — Telehealth (INDEPENDENT_AMBULATORY_CARE_PROVIDER_SITE_OTHER): Payer: Self-pay | Admitting: General Surgery

## 2012-03-02 NOTE — Telephone Encounter (Signed)
Please reschedule pt's endoscopy.  She states no one told her not to eat anything (NPO) so they couldn't do her test.  Advise her when it's rescheduled.

## 2012-03-03 ENCOUNTER — Telehealth (INDEPENDENT_AMBULATORY_CARE_PROVIDER_SITE_OTHER): Payer: Self-pay

## 2012-03-03 NOTE — Telephone Encounter (Signed)
Patient Endo was cxl'd due to patient eating breakfast the morning of the procedure.  Ms. Orsborn would like to r/s procedure.

## 2012-03-03 NOTE — Telephone Encounter (Signed)
Patient aware our surgery scheduling department will handle rescheduling her Endo.  Message routed to [facility] for a request to r/s Endoscopy.

## 2012-03-03 NOTE — Telephone Encounter (Signed)
Forward a copy of message to schedulers to r/s Endo for Gail Gentry

## 2012-04-27 ENCOUNTER — Ambulatory Visit: Payer: Self-pay | Admitting: *Deleted

## 2012-05-10 ENCOUNTER — Ambulatory Visit: Payer: Self-pay | Admitting: *Deleted

## 2012-05-11 ENCOUNTER — Encounter: Payer: Self-pay | Admitting: *Deleted

## 2012-05-11 ENCOUNTER — Encounter: Payer: 59 | Attending: General Surgery | Admitting: *Deleted

## 2012-05-11 VITALS — Ht 67.0 in | Wt 231.5 lb

## 2012-05-11 DIAGNOSIS — Z01818 Encounter for other preprocedural examination: Secondary | ICD-10-CM | POA: Insufficient documentation

## 2012-05-11 DIAGNOSIS — Z9884 Bariatric surgery status: Secondary | ICD-10-CM | POA: Insufficient documentation

## 2012-05-11 DIAGNOSIS — Z713 Dietary counseling and surveillance: Secondary | ICD-10-CM | POA: Insufficient documentation

## 2012-05-11 DIAGNOSIS — E669 Obesity, unspecified: Secondary | ICD-10-CM

## 2012-05-11 NOTE — Patient Instructions (Addendum)
Goals:  Follow Phase 3B: High Protein + Non-Starchy Vegetables  Eat 3-6 small meals/snacks, every 3-5 hrs  Increase lean protein foods to meet 60-80g goal  Aim for fluid intake of 64-120 fl oz  Add 15 grams of carbohydrate (fruit, whole grain, starchy vegetable) with meals  Avoid drinking 15 minutes before, during and 30 minutes after eating  Continue >30 min of physical activity daily  TANITA  BODY COMP RESULTS   01/06/12 02/25/12 05/11/12 %Fat  49.8%  48.5%  42.7% FM (lbs) 130.5  118.0  99.0 FFM (lbs) 132.0  125.5  132.5 TBW (lbs) 95.5  92.0  97.0

## 2012-05-11 NOTE — Progress Notes (Addendum)
Follow-up visit:  4 Months Post-Operative Gastric Bypass Surgery  Medical Nutrition Therapy:  Appt start time: 1010 end time:  1055.  Primary concerns today: Post-operative bariatric surgery nutrition management.  Kathaleya is here today for follow up with a 19 lb loss of fat mass. Reports gall bladder is better, though continues to urinate only once/day during the week. Drinks 2-3 protein shakes/day and some chicken, fish, string cheese, etc. Beef causes stomach pain. Only taking supplements ~1 time/week and states need to do better. Discussed setting alarms on phone.   TANITA  BODY COMP RESULTS  01/06/12 02/25/12 05/11/12  %Fat 49.8% 48.5% 42.7%  FM (lbs) 130.5 118.0 99.0  FFM (lbs) 132.0 125.5 132.5  TBW (lbs) 95.5 92.0 97.0   Surgery date: 11/03/11  Surgery type: Gastric Bypass  Start wt @ [facility]: 286.6 lbs  Weight today: 231.5 lbs Weight change: 12.0 lbs Total weight lost: 54.7 lbs total BMI: 36.3  Goal weight: 150-160 lbs % Goal Met: 42%   Partial 24-hr recall: Protein shake (16 oz) - 3x/day; Reports eating ~600 cal/day d/t decreased appetite. Eats mainly chicken, fish; beef causes stomach pain (r/t gallstones)   Fluid intake: water, protein shakes =  120+ oz Estimated total protein intake: 70-80g  Medications: Not taking consistently except Vyvanse.  Supplementation: Taking approximately 1 time/week  Using straws: Yes (no problems reported); makes her "slow down when drinking" Drinking while eating: No Hair loss: No Carbonated beverages: Drinks watered down ginger ale one time/day to settle stomach ETOH: No N/V/D/C: None Dumping syndrome: None reported  Recent physical activity:  4-5 days a week: 5 miles/day on elliptical; continues exercise videos several days/week (30-45 circuit training/cardio)  Progress Towards Goal(s):  In progress.   Nutritional Diagnosis:   [place]-3.3 Overweight/obesity related to recent bariatric surgery as evidenced by pt following post-operative  nutrition guidelines for continued weight loss.  Intervention:  Nutrition education.  Samples dispensed at visit:   Unjury Chicken Soup: 2 pkts Lot # [id number]; Exp: 09/14  Monitoring/Evaluation:  Dietary intake, exercise, and body weight. Follow up in 3 months for 9 months post-op visit or prn.

## 2012-05-13 ENCOUNTER — Telehealth (INDEPENDENT_AMBULATORY_CARE_PROVIDER_SITE_OTHER): Payer: Self-pay | Admitting: General Surgery

## 2012-05-13 NOTE — Telephone Encounter (Signed)
Patient called complaining of ongoing symptoms. She states she has still been having abdominal pain daily since she was last seen. She describes this pain as cramping and primarily on her right side. She does have some associated nausea occasionally but no vomiting. No specific food causes symptoms. She moves her bowels every 3-4 days, this is no change from prior to surgery. She also has noticed since her gastric bypass in December 2012 that she has trouble urinating. She only urinates about twice a day. She is drinking 80-100 oz daily. Patient had [place] of her gallbladder on 02/11/2012. Please advise of next steps.

## 2012-05-17 NOTE — Telephone Encounter (Signed)
Per verbal order from Dr Johna Sheriff- patient needs to follow through with having her upper endoscopy and needs a follow up appt with Dr Johna Sheriff after that. Gail Gentry- can you please order.

## 2012-05-17 NOTE — Telephone Encounter (Signed)
Written orders for Endoscopy placed in box for surgery schedulers.

## 2012-05-26 ENCOUNTER — Telehealth (INDEPENDENT_AMBULATORY_CARE_PROVIDER_SITE_OTHER): Payer: Self-pay

## 2012-05-26 NOTE — Telephone Encounter (Signed)
The pt called concerning the pain she has been having for months.  It is related to gallstones.  When she eats solid foods she has the pain.  It comes and goes.  She is nauseated but does not vomit.  She has no fever.  She was scheduled for an endo in April but it was cancelled due to her eating prior.  She has been waiting to get scheduled but hasn't heard anything.  I told her I saw on the chart that Debbie rescheduled for August 2nd at [facility] at  10:30.  I left Debbie a vm to see if she can call her tomorrow and also if she can move it up sooner.  She has an appointment 7/23 with Dr Johna Sheriff but I told her she needs the endo 1st and then see him so we may need to reschedule that.  I also advised for her to stay on liquids until we talk to Dr Johna Sheriff on Friday when he is back.  She will call if the pain worsens.  You can call her on her work # [phone] or cell and leave a message [phone]

## 2012-06-01 NOTE — Telephone Encounter (Signed)
GI appointments not available until the 1st week in August, Dr. Johna Sheriff will review with GI for possible work in for EGD.

## 2012-06-04 ENCOUNTER — Ambulatory Visit (HOSPITAL_COMMUNITY)
Admission: RE | Admit: 2012-06-04 | Discharge: 2012-06-04 | Disposition: A | Discharge status: Home / Self Care | Payer: 59 | Source: Ambulatory Visit | Attending: Gastroenterology | Admitting: Gastroenterology

## 2012-06-04 ENCOUNTER — Other Ambulatory Visit (HOSPITAL_COMMUNITY): Payer: Self-pay | Admitting: Gastroenterology

## 2012-06-04 DIAGNOSIS — R109 Unspecified abdominal pain: Secondary | ICD-10-CM

## 2012-06-08 ENCOUNTER — Ambulatory Visit (INDEPENDENT_AMBULATORY_CARE_PROVIDER_SITE_OTHER): Payer: Commercial Managed Care - PPO | Admitting: General Surgery

## 2012-06-18 ENCOUNTER — Ambulatory Visit (HOSPITAL_COMMUNITY): Admission: RE | Admit: 2012-06-18 | Payer: 59 | Source: Ambulatory Visit | Admitting: Surgery

## 2012-06-18 ENCOUNTER — Encounter (HOSPITAL_COMMUNITY): Admission: RE | Payer: Self-pay | Source: Ambulatory Visit

## 2012-06-18 SURGERY — EGD (ESOPHAGOGASTRODUODENOSCOPY)
Anesthesia: Moderate Sedation

## 2012-07-02 ENCOUNTER — Encounter (INDEPENDENT_AMBULATORY_CARE_PROVIDER_SITE_OTHER): Payer: Self-pay | Admitting: General Surgery

## 2012-07-02 ENCOUNTER — Ambulatory Visit (INDEPENDENT_AMBULATORY_CARE_PROVIDER_SITE_OTHER): Payer: Commercial Managed Care - PPO | Admitting: General Surgery

## 2012-07-02 DIAGNOSIS — K912 Postsurgical malabsorption, not elsewhere classified: Secondary | ICD-10-CM

## 2012-07-02 DIAGNOSIS — Z09 Encounter for follow-up examination after completed treatment for conditions other than malignant neoplasm: Secondary | ICD-10-CM

## 2012-07-02 DIAGNOSIS — Z9884 Bariatric surgery status: Secondary | ICD-10-CM

## 2012-07-02 NOTE — Progress Notes (Signed)
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 I last saw her in March. At that time she was having intermittent abdominal pain and nausea. Gallbladder ultrasound showed sludge but no definite stones. She underwent upper endoscopy by Dr. Matthias Hughs which was negative. He made some adjustments including adding Align it also cholecalciferol for possible small stones. Subsequently she has been feeling much better with essentially resolution of all her GI symptoms. She is exercising regularly. Energy level is good.  Exam: BP 110/80  Pulse 78  Temp 97.5 F (36.4 C) (Temporal)  Ht 5\' 7"  (1.702 m)  Wt 211 lb 12.8 oz (96.072 kg)  BMI 33.17 kg/m2  LMP 05/28/2012 Total weight loss 76 pounds General: Appears healthy Skin: No rash or infection Lungs: Clear equal breath sounds without increased work of breathing Cardiac: 2/6 murmur. No edema. Abdomen: Soft and nontender. No masses. No organomegaly.  Assessment and plan: Doing well post bypass with good ongoing weight loss and now resolution of her GI complaints. We discussed diet exercise tread she is and she is exercising regularly. Will obtain lab work and vitamin levels today and we'll see her back in 3-4 months per

## 2012-07-06 LAB — COMPREHENSIVE METABOLIC PANEL
ALT: 10 U/L (ref 0–35)
AST: 19 U/L (ref 0–37)
Albumin: 4.2 g/dL (ref 3.5–5.2)
Alkaline Phosphatase: 61 U/L (ref 39–117)
Glucose, Bld: 61 mg/dL — ABNORMAL LOW (ref 70–99)
Potassium: 3.1 mEq/L — ABNORMAL LOW (ref 3.5–5.3)
Sodium: 140 mEq/L (ref 135–145)
Total Bilirubin: 0.5 mg/dL (ref 0.3–1.2)
Total Protein: 7.1 g/dL (ref 6.0–8.3)

## 2012-07-06 LAB — CBC WITH DIFFERENTIAL/PLATELET
Basophils Absolute: 0 10*3/uL (ref 0.0–0.1)
Basophils Relative: 0 % (ref 0–1)
Hemoglobin: 11.6 g/dL — ABNORMAL LOW (ref 12.0–15.0)
MCHC: 33.2 g/dL (ref 30.0–36.0)
Monocytes Relative: 7 % (ref 3–12)
Neutro Abs: 3.7 10*3/uL (ref 1.7–7.7)
Neutrophils Relative %: 59 % (ref 43–77)
Platelets: 292 10*3/uL (ref 150–400)

## 2012-07-06 LAB — IRON AND TIBC: %SAT: 25 % (ref 20–55)

## 2012-07-06 LAB — T4: T4, Total: 11.1 ug/dL (ref 5.0–12.5)

## 2012-07-06 LAB — TSH: TSH: 1.486 u[IU]/mL (ref 0.350–4.500)

## 2012-07-06 LAB — VITAMIN B12: Vitamin B-12: 1619 pg/mL — ABNORMAL HIGH (ref 211–911)

## 2012-07-09 LAB — VITAMIN B1: Vitamin B1 (Thiamine): 10 nmol/L (ref 8–30)

## 2012-07-28 ENCOUNTER — Telehealth (INDEPENDENT_AMBULATORY_CARE_PROVIDER_SITE_OTHER): Payer: Self-pay

## 2012-07-28 DIAGNOSIS — E876 Hypokalemia: Secondary | ICD-10-CM

## 2012-07-28 NOTE — Telephone Encounter (Signed)
Left message for patient to call our office re: lab results, patient potassium low and a repeat BMET this week.

## 2012-07-28 NOTE — Telephone Encounter (Signed)
Patient aware of lab results, patient will go to [facility] in [facility] for [facility]

## 2012-08-03 ENCOUNTER — Encounter (INDEPENDENT_AMBULATORY_CARE_PROVIDER_SITE_OTHER): Payer: Self-pay

## 2012-08-10 ENCOUNTER — Encounter: Payer: Self-pay | Admitting: *Deleted

## 2012-08-10 ENCOUNTER — Encounter: Payer: 59 | Attending: General Surgery | Admitting: *Deleted

## 2012-08-10 VITALS — Ht 67.0 in | Wt 206.0 lb

## 2012-08-10 DIAGNOSIS — Z09 Encounter for follow-up examination after completed treatment for conditions other than malignant neoplasm: Secondary | ICD-10-CM | POA: Insufficient documentation

## 2012-08-10 DIAGNOSIS — Z713 Dietary counseling and surveillance: Secondary | ICD-10-CM | POA: Insufficient documentation

## 2012-08-10 DIAGNOSIS — Z9884 Bariatric surgery status: Secondary | ICD-10-CM | POA: Insufficient documentation

## 2012-08-10 DIAGNOSIS — E669 Obesity, unspecified: Secondary | ICD-10-CM | POA: Insufficient documentation

## 2012-08-10 LAB — BASIC METABOLIC PANEL
BUN: 11 mg/dL (ref 6–23)
Calcium: 9.7 mg/dL (ref 8.4–10.5)
Creat: 0.59 mg/dL (ref 0.50–1.10)

## 2012-08-10 NOTE — Patient Instructions (Addendum)
Goals:  Follow Phase 3B: High Protein + Non-Starchy Vegetables  Eat 3-6 small meals/snacks, every 3-5 hrs  Increase lean protein foods to meet 60-80g goal  Aim for fluid intake of 64-120 fl oz  Add 15 grams of carbohydrate (fruit, whole grain, starchy vegetable) with meals  Avoid drinking 15 minutes before, during and 30 minutes after eating  Continue >30 min of physical activity daily   TANITA  BODY COMP RESULTS  01/06/12 02/25/12 05/11/12 08/10/12  FM (lbs) 130.5 118.0 99.0 90.0  FFM (lbs) 132.0 125.5 132.5 116.0  TBW (lbs) 95.5 92.0 97.0 85.0

## 2012-08-10 NOTE — Progress Notes (Signed)
Follow-up visit:  9 Months Post-Operative Gastric Bypass Surgery  Medical Nutrition Therapy:  Appt start time:  840 end time:  915.  Primary concerns today: Post-operative bariatric surgery nutrition management.  Gail Gentry is here today for follow up. Doing very well with additional wt loss of 25.5 lbs since last visit in 04/2012. Reports eating breakfast now, taking supplements daily, and increasing protein intake. Gallbladder much better now since starting Actigall, though pt states MD says she will need cholecystectomy at some point.  TANITA  BODY COMP RESULTS  01/06/12 02/25/12 05/11/12 08/10/12  FM (lbs) 130.5 118.0 99.0 90.0  FFM (lbs) 132.0 125.5 132.5 116.0  TBW (lbs) 95.5 92.0 97.0 85.0   Surgery date: 11/03/11  Surgery type: Gastric Bypass  Start wt @ [facility]: 286.6 lbs (308.0 lbs starting in 09/2011)  Weight today: 206.0 lbs Weight change: 25.5 lbs Total weight lost: 80.2 lbs total (102 lbs) BMI: 32.3  Goal weight: 160 lbs % Goal Met: 63%   Partial 24-hr recall: Protein shake (16 oz) - 3-4x/day. Reports eating breakfast now and increasing protein intake Eats mainly chicken, fish and vegetables  Fluid intake: water, protein shakes =  120+ oz Estimated total protein intake: 70-90g  Medications: Taking consistently; Started Chemical engineer for gallstones  Supplementation: Taking consistently  Using straws: Yes (no problems reported); makes her "slow down when drinking" Drinking while eating: No Hair loss: No Carbonated beverages: Occasionally drinks watered down ginger ale one time/day to settle stomach ETOH: No N/V/D/C: No Dumping syndrome: None reported  Recent physical activity:  6-7 days/wk @ 5 miles/day on elliptical; continues exercise videos several days/week (30-45 circuit training/cardio)  Progress Towards Goal(s):  In progress.   Nutritional Diagnosis:   [place]-3.3 Overweight/obesity related to recent bariatric surgery as evidenced by patient following  post-operative nutrition guidelines for continued weight loss.  Intervention:  Nutrition education.  Monitoring/Evaluation:  Dietary intake, exercise, and body weight. Follow up in 3 months for 12 months post-op visit or prn.

## 2012-08-12 ENCOUNTER — Telehealth (INDEPENDENT_AMBULATORY_CARE_PROVIDER_SITE_OTHER): Payer: Self-pay

## 2012-08-12 NOTE — Telephone Encounter (Signed)
Left message for patient re: normal lab results

## 2012-08-12 NOTE — Telephone Encounter (Signed)
Message copied by Maryan Puls on Thu Aug 12, 2012  7:27 PM ------      Message from: Glenna Fellows T      Created: Wed Aug 11, 2012 12:47 PM       Please call patient "All lab looks OK"

## 2012-11-08 ENCOUNTER — Ambulatory Visit: Payer: 59 | Admitting: *Deleted

## 2012-11-25 ENCOUNTER — Telehealth (INDEPENDENT_AMBULATORY_CARE_PROVIDER_SITE_OTHER): Payer: Self-pay

## 2012-11-25 ENCOUNTER — Ambulatory Visit (INDEPENDENT_AMBULATORY_CARE_PROVIDER_SITE_OTHER): Payer: Commercial Managed Care - PPO | Admitting: General Surgery

## 2012-11-25 ENCOUNTER — Encounter (INDEPENDENT_AMBULATORY_CARE_PROVIDER_SITE_OTHER): Payer: Self-pay | Admitting: General Surgery

## 2012-11-25 VITALS — BP 138/82 | HR 65 | Temp 97.7°F | Resp 18 | Ht 67.5 in | Wt 187.6 lb

## 2012-11-25 DIAGNOSIS — K912 Postsurgical malabsorption, not elsewhere classified: Secondary | ICD-10-CM

## 2012-11-25 DIAGNOSIS — Z09 Encounter for follow-up examination after completed treatment for conditions other than malignant neoplasm: Secondary | ICD-10-CM

## 2012-11-25 DIAGNOSIS — Z9884 Bariatric surgery status: Secondary | ICD-10-CM | POA: Insufficient documentation

## 2012-11-25 NOTE — Progress Notes (Signed)
History: Patient returns for her one-year followup status post laparoscopic Roux-en-Y gastric bypass the surgery date of December 2012. She has continued to have excellent success with her weight loss. She feels overall extremely well. As noted previously she was having some bloating and gas and some epigastric discomfort. She has been on Align and also cholecalciferol with complete resolution of those symptoms. She is exercising regularly.  Previous comorbidities of chronic joint pain markedly improved and stress incontinence and lower extremity edema resolved.  Exam: BP 138/82  Pulse 65  Temp 97.7 F (36.5 C) (Temporal)  Resp 18  Ht 5' 7.5" (1.715 m)  Wt 187 lb 10.1 oz (85.108 kg)  BMI 28.95 kg/m2 Total weight loss 100 pounds, 24 pounds since last visit  General: Appears well Lungs: Clear breath sounds bilaterally Abdomen: Soft and nontender. Wounds well healed Extremities: No joint swelling or edema  Assessment and plan: Excellent weight loss and no pericardial complications at one year this operatively with good resolution of comorbidities. She's had a lot of straining her marriage based on her weight loss and we will get her back to Dr.Lurie for counseling which she is strongly in favor of. We discussed her allergies to maintain her weight loss in the coming year. We will check one year lab work and call with the results. Return 12 months or sooner if needed.

## 2012-11-25 NOTE — Telephone Encounter (Signed)
Called patient with appointment with Dr. Cyndia Skeeters on 12/02/12 @ 1:00 pm.

## 2012-11-25 NOTE — Patient Instructions (Signed)
For the next year be sure to maintain your good exercise habits  Dr. Etter Sjogren  [phone], plastic surgeon

## 2012-12-08 LAB — CBC WITH DIFFERENTIAL/PLATELET
Eosinophils Absolute: 0.1 10*3/uL (ref 0.0–0.7)
Hemoglobin: 12 g/dL (ref 12.0–15.0)
Lymphocytes Relative: 41 % (ref 12–46)
Lymphs Abs: 2.3 10*3/uL (ref 0.7–4.0)
MCH: 27.6 pg (ref 26.0–34.0)
MCV: 82.1 fL (ref 78.0–100.0)
Monocytes Relative: 6 % (ref 3–12)
Neutrophils Relative %: 51 % (ref 43–77)
Platelets: 239 10*3/uL (ref 150–400)
RBC: 4.35 MIL/uL (ref 3.87–5.11)
WBC: 5.8 10*3/uL (ref 4.0–10.5)

## 2012-12-08 LAB — COMPREHENSIVE METABOLIC PANEL
ALT: 9 U/L (ref 0–35)
Albumin: 4.3 g/dL (ref 3.5–5.2)
CO2: 32 mEq/L (ref 19–32)
Calcium: 10.2 mg/dL (ref 8.4–10.5)
Chloride: 103 mEq/L (ref 96–112)
Glucose, Bld: 88 mg/dL (ref 70–99)
Sodium: 140 mEq/L (ref 135–145)
Total Bilirubin: 0.5 mg/dL (ref 0.3–1.2)
Total Protein: 7.2 g/dL (ref 6.0–8.3)

## 2012-12-08 LAB — VITAMIN B12: Vitamin B-12: 538 pg/mL (ref 211–911)

## 2012-12-08 LAB — LIPID PANEL
Cholesterol: 145 mg/dL (ref 0–200)
Triglycerides: 52 mg/dL (ref <150)

## 2012-12-08 LAB — T4: T4, Total: 9.5 ug/dL (ref 5.0–12.5)

## 2012-12-08 LAB — IRON AND TIBC: %SAT: 32 % (ref 20–55)

## 2012-12-08 LAB — TSH: TSH: 1.251 u[IU]/mL (ref 0.350–4.500)

## 2012-12-10 LAB — VITAMIN B1

## 2012-12-12 IMAGING — RF DG UGI W/ GASTROGRAFIN
10 series · 10 of 10 positions shown · non-contrast
Comparison: None.

CLINICAL DATA: Postop day #1 from gastric bypass surgery

WATER SOLUBLE UPPER GI SERIES
TECHNIQUE: Single-column upper GI series was performed using 50cc
water soluble contrast.
Fluoroscopy Time: 1.6 minutes

[Series 1: run · 1 of 1 slices shown (1 of 8)]
[im 1/1]
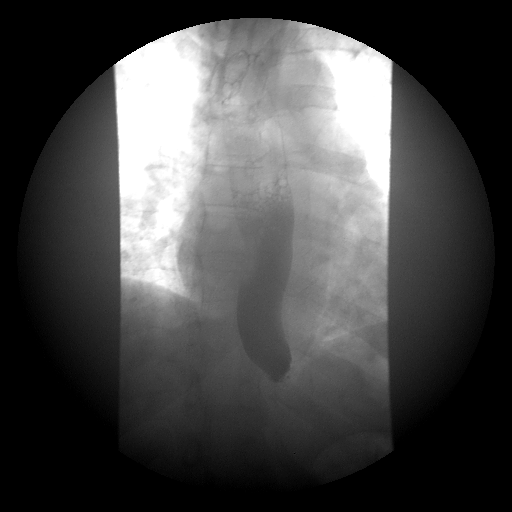

[Series 1: run · 1 of 1 slices shown (2 of 8)]
[im 1/1]
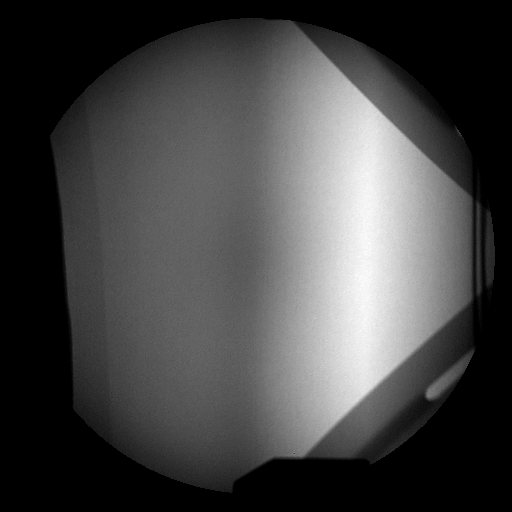

[Series 2: run · 1 of 1 slices shown (3 of 8)]
[im 1/1]
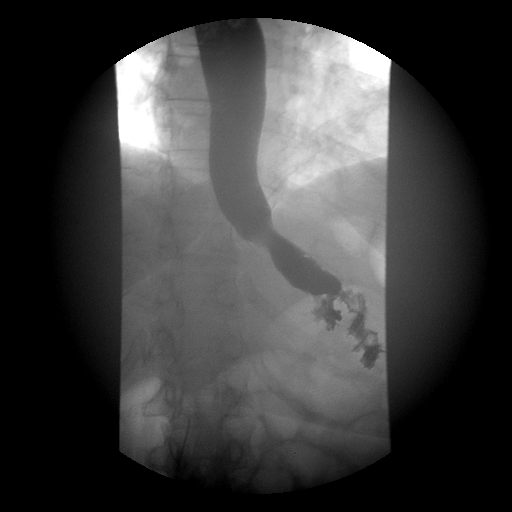

[Series 3: run · 1 of 1 slices shown (4 of 8)]
[im 1/1]
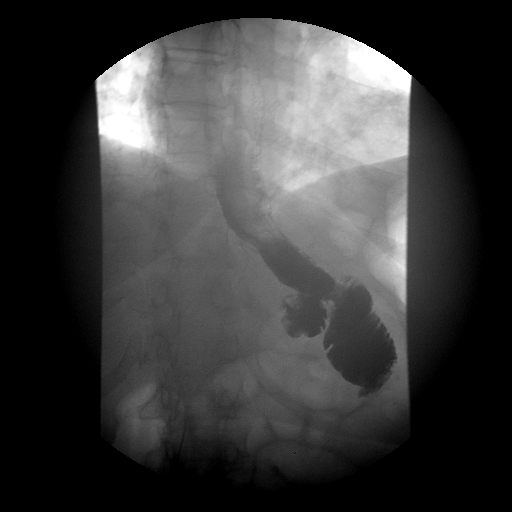

[Series 4: run · 1 of 1 slices shown (5 of 8)]
[im 1/1]
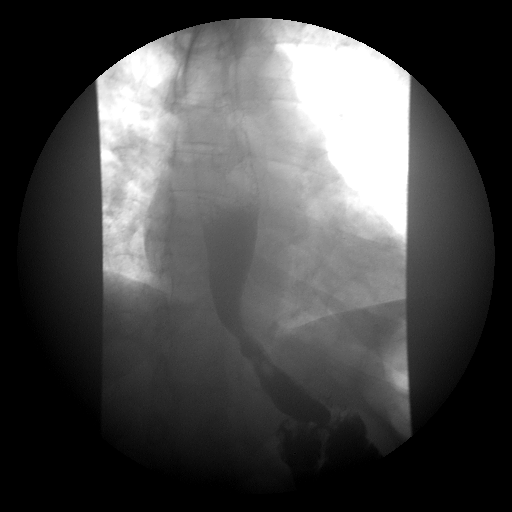

[Series 5: run · 1 of 1 slices shown (6 of 8)]
[im 1/1]
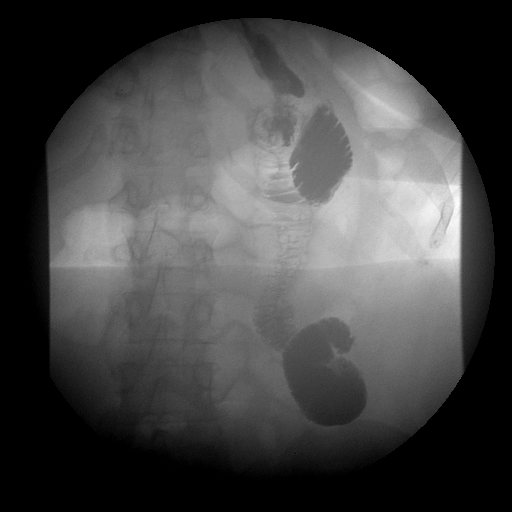

[Series 6: run · 1 of 1 slices shown (7 of 8)]
[im 1/1]
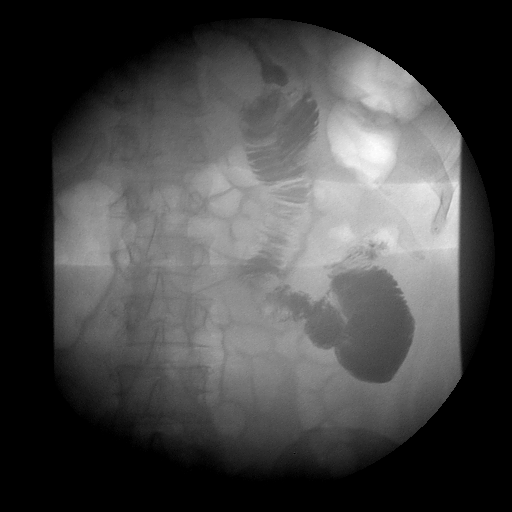

[Series 7: run · 1 of 1 slices shown (8 of 8)]
[im 1/1]
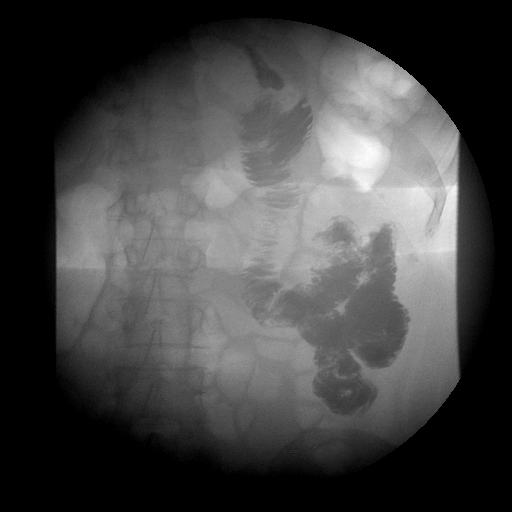

[Series 1001: view not recorded · 0.20mm/px · 1 of 1 slices shown (1 of 2)]
[im 1/1]
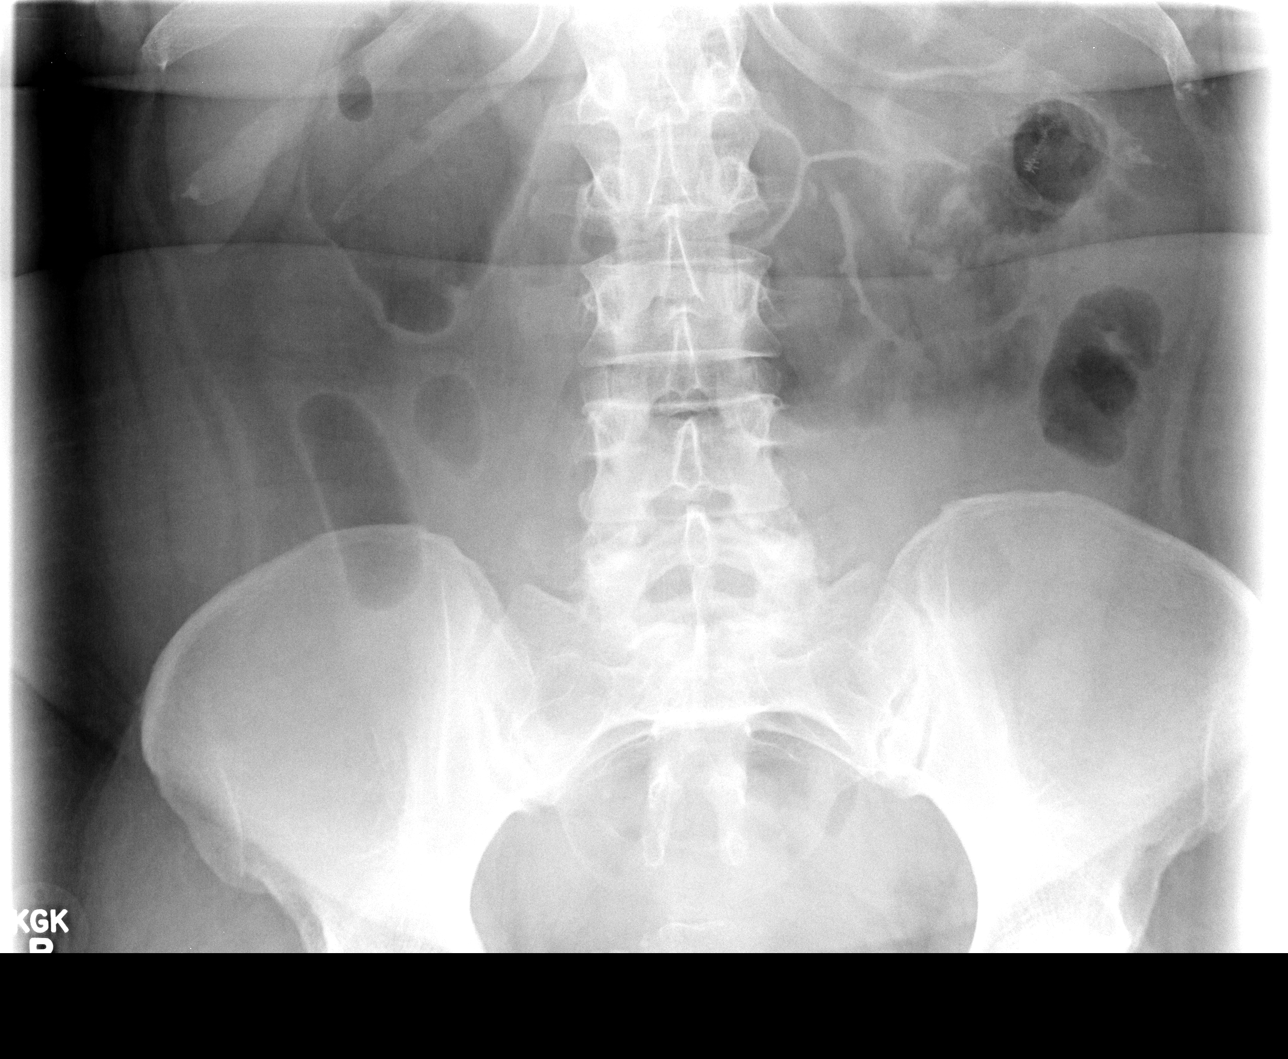

[Series 1002: view not recorded · 0.20mm/px · 1 of 1 slices shown (2 of 2)]
[im 1/1]
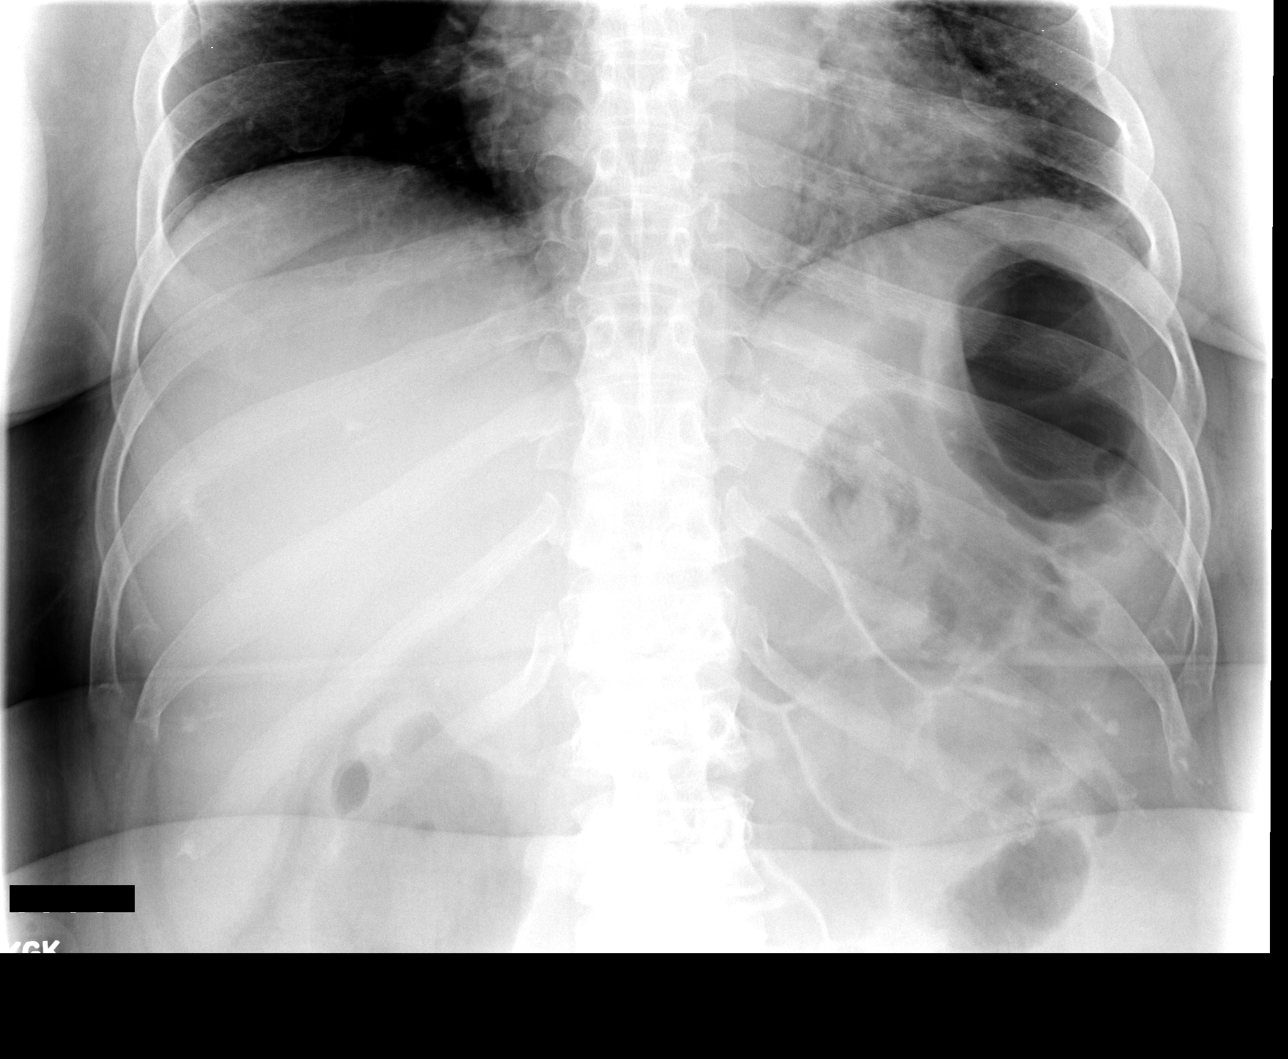

[10 of 10 positions shown; findings below may reference images not displayed]

FINDINGS: The scout radiograph shows mild gaseous distention of
small bowel, likely representing a mild postop ileus.

Upper GI series shows no evidence of esophageal mass or stricture.
There is no evidence of hiatal hernia.  A small gastric pouch is
noted, which is normal in appearance.  There is prompt passage of
contrast into the efferent small bowel, which is nondilated.  No
evidence of contrast leak or extravasation.  The efferent small
bowel is normal in appearance.  No significant contrast filling of
afferent loop noted.
IMPRESSION: Normal postop appearance status post gastric bypass surgery.  No
evidence of contrast leak or obstruction.

## 2012-12-13 ENCOUNTER — Telehealth (INDEPENDENT_AMBULATORY_CARE_PROVIDER_SITE_OTHER): Payer: Self-pay | Admitting: General Surgery

## 2012-12-13 ENCOUNTER — Other Ambulatory Visit (INDEPENDENT_AMBULATORY_CARE_PROVIDER_SITE_OTHER): Payer: Self-pay | Admitting: General Surgery

## 2012-12-13 DIAGNOSIS — Z09 Encounter for follow-up examination after completed treatment for conditions other than malignant neoplasm: Secondary | ICD-10-CM

## 2012-12-13 DIAGNOSIS — Z9884 Bariatric surgery status: Secondary | ICD-10-CM

## 2012-12-13 DIAGNOSIS — K912 Postsurgical malabsorption, not elsewhere classified: Secondary | ICD-10-CM

## 2012-12-13 NOTE — Telephone Encounter (Signed)
Katrina, [facility] [place], called to report they did not report a Vit B1, as ordered with other labs on 12/08/12.  They have contacted to pt return for blood to be redrawn (at no charge to her,) but need a new ordered entered and FAXed to them at :  [phone].  The order was entered and sent.  FYI.

## 2013-01-01 ENCOUNTER — Other Ambulatory Visit: Payer: Self-pay

## 2013-01-19 ENCOUNTER — Emergency Department (HOSPITAL_COMMUNITY)
Admission: EM | Admit: 2013-01-19 | Discharge: 2013-01-19 | Disposition: A | Discharge status: Home / Self Care | Payer: 59 | Attending: Emergency Medicine | Admitting: Emergency Medicine

## 2013-01-19 ENCOUNTER — Encounter (HOSPITAL_COMMUNITY): Payer: Self-pay | Admitting: Emergency Medicine

## 2013-01-19 DIAGNOSIS — Z79899 Other long term (current) drug therapy: Secondary | ICD-10-CM | POA: Insufficient documentation

## 2013-01-19 DIAGNOSIS — Z862 Personal history of diseases of the blood and blood-forming organs and certain disorders involving the immune mechanism: Secondary | ICD-10-CM | POA: Insufficient documentation

## 2013-01-19 DIAGNOSIS — M79609 Pain in unspecified limb: Secondary | ICD-10-CM | POA: Insufficient documentation

## 2013-01-19 DIAGNOSIS — R011 Cardiac murmur, unspecified: Secondary | ICD-10-CM | POA: Insufficient documentation

## 2013-01-19 DIAGNOSIS — Z8739 Personal history of other diseases of the musculoskeletal system and connective tissue: Secondary | ICD-10-CM | POA: Insufficient documentation

## 2013-01-19 LAB — D-DIMER, QUANTITATIVE: D-Dimer, Quant: 0.42 ug/mL-FEU (ref 0.00–0.48)

## 2013-01-19 NOTE — ED Notes (Signed)
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  Small bump felt under skin on site.

## 2013-01-19 NOTE — ED Provider Notes (Signed)
History    This chart was scribed for non-physician practitioner working with Gail Co, MD by Gerlean Ren, ED Scribe. This patient was seen in room WTR7/WTR7 and the patient's care was started at 8:26 PM.    CSN: [id number]  Arrival date & time 01/19/13  1816   First MD Initiated Contact with Patient 01/19/13 1834      Chief Complaint  Patient presents with  . Leg Pain     The history is provided by the patient. No language interpreter was used.  Gail Gentry is a 47 y.o. female who presents to the Emergency Department complaining of burning and itching pain along right lower lateral thigh just above the knee with sudden onset several hours ago while walking out of work that is tender to touch.  Pain has become gradually worse.  Pt reports she ambulates frequently at work.  Pt also reports a knot in the area that suddenly appeared while pt was rubbing the area shortly after pain onset.   No erythema, warmth, or swelling of the area.  Pt denies any sort of fall, injury, or any sort of bite at the time of the pain onset.  Pt is not currently taking any hormone replacement therapy or birth control.  Pt has no h/o DVT.  No recent long trips or surgeries.  Patient is however, concerned that she may have a DVT.  Patient denies CP or SOB.  Past Medical History  Diagnosis Date  . Anemia   . Joint pain   . Seasonal allergies   . Edema of both legs   . Heart murmur     mild, no cardiologist  . PONV (postoperative nausea and vomiting)     Past Surgical History  Procedure Laterality Date  . Hernia repair  Umbilical  . Tubal ligation  1994  . 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 Hypertension Mother   . Diabetes Mother   . Arthritis Mother   . Anemia Mother   . Heart disease  Maternal Grandmother     History  Substance Use Topics  . Smoking status: Never Smoker   . Smokeless tobacco: Never Used  . Alcohol Use: No    No OB history provided.   Review of Systems  Musculoskeletal:       Right leg pain  All other systems reviewed and are negative.    Allergies  Tomato; Benadryl; Roxicet; and Sulfa antibiotics  Home Medications   Current Outpatient Rx  Name  Route  Sig  Dispense  Refill  . acetaminophen (TYLENOL) 500 MG tablet   Oral   Take 1,000 mg by mouth every 4 (four) hours as needed. For pain         . Calcium Citrate (CITRACAL PO)   Oral   Take by mouth.         . Cholecalciferol (WELLESSE VITAMIN D3) 1000 UNIT/10ML LIQD   Oral   Take 1,000 mcg by mouth daily.         . Cyanocobalamin (B-12 PO)   Sublingual   Place under the tongue daily.         . Fe Fum-FA-B Cmp-C-Zn-Mg-Mn-Cu (CENTRATEX PO)   Oral   Take by mouth.         Marland Kitchen  lisdexamfetamine (VYVANSE) 70 MG capsule   Oral   Take 70 mg by mouth every morning.          . multivitamin-iron-minerals-folic acid (CENTRUM) chewable tablet   Oral   Chew 1 tablet by mouth daily.         . ursodiol (ACTIGALL) 300 MG capsule   Oral   Take 300 mg by mouth 2 (two) times daily.           BP 120/68  Pulse 83  Temp(Src) 98.6 F (37 C) (Oral)  Resp 16  SpO2 100%  LMP 11/13/2012  Physical Exam  Nursing note and vitals reviewed. Constitutional: She is oriented to person, place, and time. She appears well-developed and well-nourished.  HENT:  Head: Normocephalic.  Eyes: EOM are normal.  Neck: No tracheal deviation present.  Cardiovascular: Normal rate, regular rhythm and normal heart sounds.   Pulmonary/Chest: Effort normal and breath sounds normal. No respiratory distress. She has no wheezes.  Musculoskeletal: Normal range of motion.  Tenderness to palpation of the right lateral thigh just superior and lateral to the patella.  Vein palpated and tender.  No  erythema, no warmth, no swelling.    Neurological: She is alert and oriented to person, place, and time.  Distal sensation of right toes intact.  Skin: Skin is warm.  Psychiatric: She has a normal mood and affect. Her behavior is normal.    ED Course  Procedures (including critical care time) DIAGNOSTIC STUDIES: Oxygen Saturation is 100% on room air, normal by my interpretation.    COORDINATION OF CARE: 8:36 PM- Patient informed of clinical course, understands medical decision-making process, and agrees with plan.     Labs Reviewed - No data to display No results found.   No diagnosis found.    MDM  Patient presenting with RLE pain just superior to the patella.  Vein palpated and is tender.  Suspect Phlebitis.  Patient is concerned about having a DVT.  She does not have any risk factors and d-dimer was negative.  No erythema, edema, or warmth of the area.  However, patient told that she could return in the morning tomorrow to have Doppler Ultrasound performed.  Future order placed.  Patient declined getting a Lovenox shot today.          Pascal Lux [place], PA-C 01/20/13 2200

## 2013-01-22 NOTE — ED Provider Notes (Signed)
Medical screening examination/treatment/procedure(s) were performed by non-physician practitioner and as supervising physician I was immediately available for consultation/collaboration.   [name], MD 01/22/13 [phone]

## 2013-02-03 ENCOUNTER — Other Ambulatory Visit: Payer: Self-pay | Admitting: Family Medicine

## 2013-02-03 DIAGNOSIS — Z1231 Encounter for screening mammogram for malignant neoplasm of breast: Secondary | ICD-10-CM

## 2013-02-08 ENCOUNTER — Ambulatory Visit (HOSPITAL_BASED_OUTPATIENT_CLINIC_OR_DEPARTMENT_OTHER): Payer: 59

## 2013-02-09 ENCOUNTER — Ambulatory Visit (HOSPITAL_BASED_OUTPATIENT_CLINIC_OR_DEPARTMENT_OTHER): Payer: 59

## 2013-02-09 ENCOUNTER — Other Ambulatory Visit: Payer: Self-pay | Admitting: Family Medicine

## 2013-02-09 LAB — T4, FREE: Free T4: 0.96 ng/dL (ref 0.80–1.80)

## 2013-02-10 LAB — ESTRADIOL: Estradiol: 42.8 pg/mL

## 2013-02-10 LAB — TESTOSTERONE: Testosterone: <10 ng/dL — ABNORMAL LOW (ref 10–70)

## 2013-02-10 LAB — PROGESTERONE: Progesterone: 11.5 ng/mL

## 2013-02-11 ENCOUNTER — Ambulatory Visit (HOSPITAL_BASED_OUTPATIENT_CLINIC_OR_DEPARTMENT_OTHER)
Admission: RE | Admit: 2013-02-11 | Discharge: 2013-02-11 | Disposition: A | Discharge status: Home / Self Care | Payer: 59 | Source: Ambulatory Visit | Attending: Family Medicine | Admitting: Family Medicine

## 2013-02-11 DIAGNOSIS — Z1231 Encounter for screening mammogram for malignant neoplasm of breast: Secondary | ICD-10-CM | POA: Insufficient documentation

## 2013-02-15 LAB — ESTRONE: Estrone: 32 pg/mL

## 2013-02-22 ENCOUNTER — Telehealth: Payer: Self-pay | Admitting: *Deleted

## 2013-02-22 NOTE — Telephone Encounter (Signed)
PT HAS QUESTIONS IN REFERENCE TO LABS-  AND PT SAI

## 2013-03-02 ENCOUNTER — Encounter: Payer: Self-pay | Admitting: Family Medicine

## 2013-03-02 ENCOUNTER — Ambulatory Visit (INDEPENDENT_AMBULATORY_CARE_PROVIDER_SITE_OTHER): Payer: 59 | Admitting: Family Medicine

## 2013-03-02 VITALS — BP 114/74 | HR 68 | Wt 190.0 lb

## 2013-03-02 DIAGNOSIS — F39 Unspecified mood [affective] disorder: Secondary | ICD-10-CM

## 2013-03-02 DIAGNOSIS — R41840 Attention and concentration deficit: Secondary | ICD-10-CM

## 2013-03-02 MED ORDER — LIOTHYRONINE SODIUM 5 MCG PO TABS: ORAL_TABLET | ORAL | Status: DC

## 2013-03-02 MED ORDER — LISDEXAMFETAMINE DIMESYLATE 70 MG PO CAPS: 70.0000 mg | ORAL_CAPSULE | ORAL | Status: DC

## 2013-03-02 NOTE — Patient Instructions (Addendum)
Diet Following Bariatric Surgery The bariatric diet is designed to provide fluids and nourishment while promoting weight loss after bariatric surgery. The diet is divided into 3 stages. The rate of progression varies based on individual food tolerance. DIET FOLLOWING BARIATRIC SURGERY The diet following surgery is divided into 3 stages to allow a gradual adjustment. It is very important to the success of your surgery to:  Progress to each stage slowly.  Eat at set times.  Chew food well and stop eating when you are full.  Not drink liquids 30 minutes before and after meals. If you feel tightness or pressure in your chest, that means you are full. Wait 30 minutes before you try to eat again. STAGE 1 BARIATRIC DIET - ABOUT 2 WEEKS IN DURATION   The diet begins the day of surgery. It will last about 1 to 2 weeks after surgery. Your surgeon may have individual guidelines for you about specific foods or the progression of your diet. Follow your surgeon's guidelines.  If clear liquids are well-tolerated without vomiting, your caregiver will add a 4 oz to 6 oz high protein, low-calorie liquid supplement. You could add this to your meal plan 3 times daily. You will need at least 60 g to 80 g of protein daily or as determined by your Registered Dietitian.  Guidelines for choosing a protein supplement include:  At least 15 g of protein per 8 oz serving.  Less than 20 g total carbohydrate per 8 oz serving.  Less than 5 g fat per 8 oz serving.  Avoid carbonated beverages, caffeine, alcohol, and concentrated sweets such as sugar, cakes, and cookies.  Right after surgery, you may only be able to eat 3 to 4 tsp per meal. Your maximum volume should not exceed  to  cup total. Do not eat or drink more than 1 oz or 2 tbs every 15 minutes.  Take a chewable multivitamin and mineral supplement.  Drink at least 48 oz of fluid daily, which includes your protein supplement. Food and beverages from the  list below are allowed at set times (for example at 8 AM, 12 noon, or 5 PM):  Decaffeinated coffee or tea.  100% fruit juice.  Diet or sugar-free drinks.  Broth.  Blenderized soup.  Skim milk or lactose-free milk.  Sugar-free gelatin dessert or frozen ice pops.  Mashed potatoes.  Yogurt (artificially sweetened).  Sugar-free pudding.  Blended low-fat cottage cheese.  Unsweetened applesauce, grits, or hot wheat cereal. Four to six ounces of a liquid protein supplement from the list below is recommended for snacks at 10 AM, 2 PM, and 8 PM.  STAGE 2 BARIATRIC DIET (SOFT DIET) - ABOUT 4 WEEKS IN DURATION  About 2 weeks after surgery, your caregiver will progress your diet to this stage. Foods may need to be blended to the consistency of applesauce. Choose low-fat foods (less than 5 g of fat per serving) and avoid concentrated sweets and sugar (less than 10 g of sugar per serving). Meals should not exceed  to  cup total. This stage will last about 4 weeks. It is recommended that you meet with your dietitian at this stage to begin preparation for the last stage. This stage consists of 3 meals a day with a liquid protein supplement between meals twice daily. Do not drink liquids with foods. You must wait 30 minutes for the stomach pouch to empty before drinking. Chew food well. The food must be almost liquified before swallowing. Soft foods from the   list below can now be slowly added to your diet:  Soft fruit (soft canned fruit in light syrup or natural juice, banana, melon, peaches, pears, or strawberries).  Cooked vegetables.  Toast or crackers (becomes soft after chewing 20 times).  Hot wheat cereal.  Fish.  Eggs (scrambled, soft-boiled). STAGE 3 BARIATRIC DIET (REGULAR DIET) - ABOUT 6 to 8 WEEKS AFTER SURGERY About 6 to 8 weeks after surgery, you will be advanced to food that is regular in texture. This diet should include all food groups. The diet will continue to promote  weight loss. Meals should not exceed  to 1 cup total. Your dietitian will be available to assist you in meal planning and additional behavioral strategies to make this final stage a long-term success. Slowly add foods of regular consistency and remember:  Eat only at your chosen meal times.  Minimize drinking with meals. You should drink 30 minutes before eating. Do not start drinking again for about 2 hours after eating.  Chew food well. Take small bites.  Think about the portion size of a healthy frozen meal. You will be able to eat most of this.  Make sure your meal is balanced with starch, protein, fruits, and vegetables.  When you feel full, stop eating. Document Released: 05/10/2003 Document Revised: 01/26/2012 Document Reviewed: 01/31/2011 [employer] Patient Information 2013 [employer].  

## 2013-03-02 NOTE — Progress Notes (Signed)
Subjective:     Patient ID: Gail Gentry, female   DOB: December 22, 1965, 47 y.o.   MRN: [id number]  HPI Gail Gentry is here today to discuss the conditions listed below.    1)  Mood:  She was given Depakote at her last visit after we discussed her mood changes.  She took it for a short time then stopped it because she thought that it triggered her to have a period which was very painful.  She thought that it did help her mood.       2) ADD:  She needs a get a refill on her Vyvanse. She is doing fine on her current dosage.  She is eating and sleeping fine.  This dosage seems to continue to be appropriate.   Review of Systems  Constitutional: Negative for activity change, appetite change, fatigue and unexpected weight change.  Respiratory: Negative for chest tightness and shortness of breath.   Cardiovascular: Negative for chest pain, palpitations and leg swelling.  Gastrointestinal: Negative for abdominal pain.  Endocrine: Positive for cold intolerance.  Genitourinary: Negative.   Neurological: Negative for headaches.  Hematological: Does not bruise/bleed easily.  Psychiatric/Behavioral: Positive for dysphoric mood and decreased concentration. Negative for sleep disturbance. The patient is nervous/anxious.    Past Medical History  Diagnosis Date  . Anemia   . Joint pain   . Seasonal allergies   . Edema of both legs   . Heart murmur     mild, no cardiologist  . PONV (postoperative nausea and vomiting)    Family History  Problem Relation Age of Onset  . Hypertension Mother   . Diabetes Mother   . Arthritis Mother   . Anemia Mother   . Heart disease Maternal Grandmother        Objective:   Physical Exam  Constitutional: She appears well-nourished. No distress.  HENT:  Head: Normocephalic.  Eyes: Conjunctivae are normal. No scleral icterus.  Neck: Neck supple. Thyromegaly present.  Cardiovascular: Normal rate, regular rhythm and normal heart sounds.   Pulmonary/Chest: Effort  normal and breath sounds normal.  Abdominal: Soft. Bowel sounds are normal. She exhibits no distension and no mass. There is no tenderness.  Musculoskeletal: Normal range of motion. She exhibits edema. She exhibits no tenderness.  Lymphadenopathy:    She has no cervical adenopathy.  Neurological: She is alert.  Skin: Skin is warm and dry.  Psychiatric: She has a normal mood and affect. Her behavior is normal. Judgment and thought content normal.       Assessment:     Mood Disorder ADD    Plan:     1)  She is going to try the Depakote again.  2)  She was given a 90 day supply of Vyvanse.

## 2013-03-06 ENCOUNTER — Encounter: Payer: Self-pay | Admitting: Family Medicine

## 2013-03-06 DIAGNOSIS — R41840 Attention and concentration deficit: Secondary | ICD-10-CM | POA: Insufficient documentation

## 2013-03-06 DIAGNOSIS — F39 Unspecified mood [affective] disorder: Secondary | ICD-10-CM | POA: Insufficient documentation

## 2013-05-31 ENCOUNTER — Encounter (HOSPITAL_COMMUNITY): Payer: Self-pay

## 2013-05-31 ENCOUNTER — Emergency Department (HOSPITAL_COMMUNITY)
Admission: EM | Admit: 2013-05-31 | Discharge: 2013-05-31 | Disposition: A | Discharge status: Home / Self Care | Payer: 59 | Attending: Emergency Medicine | Admitting: Emergency Medicine

## 2013-05-31 DIAGNOSIS — Z3202 Encounter for pregnancy test, result negative: Secondary | ICD-10-CM | POA: Insufficient documentation

## 2013-05-31 DIAGNOSIS — R0989 Other specified symptoms and signs involving the circulatory and respiratory systems: Secondary | ICD-10-CM | POA: Insufficient documentation

## 2013-05-31 DIAGNOSIS — R42 Dizziness and giddiness: Secondary | ICD-10-CM | POA: Insufficient documentation

## 2013-05-31 DIAGNOSIS — R011 Cardiac murmur, unspecified: Secondary | ICD-10-CM | POA: Insufficient documentation

## 2013-05-31 DIAGNOSIS — R0609 Other forms of dyspnea: Secondary | ICD-10-CM | POA: Insufficient documentation

## 2013-05-31 DIAGNOSIS — R0682 Tachypnea, not elsewhere classified: Secondary | ICD-10-CM | POA: Insufficient documentation

## 2013-05-31 DIAGNOSIS — Z8739 Personal history of other diseases of the musculoskeletal system and connective tissue: Secondary | ICD-10-CM | POA: Insufficient documentation

## 2013-05-31 DIAGNOSIS — R0602 Shortness of breath: Secondary | ICD-10-CM | POA: Insufficient documentation

## 2013-05-31 DIAGNOSIS — D649 Anemia, unspecified: Secondary | ICD-10-CM | POA: Insufficient documentation

## 2013-05-31 DIAGNOSIS — Z872 Personal history of diseases of the skin and subcutaneous tissue: Secondary | ICD-10-CM | POA: Insufficient documentation

## 2013-05-31 DIAGNOSIS — F411 Generalized anxiety disorder: Secondary | ICD-10-CM | POA: Insufficient documentation

## 2013-05-31 DIAGNOSIS — R002 Palpitations: Secondary | ICD-10-CM | POA: Insufficient documentation

## 2013-05-31 DIAGNOSIS — R0789 Other chest pain: Secondary | ICD-10-CM | POA: Insufficient documentation

## 2013-05-31 DIAGNOSIS — R064 Hyperventilation: Secondary | ICD-10-CM | POA: Insufficient documentation

## 2013-05-31 DIAGNOSIS — Z79899 Other long term (current) drug therapy: Secondary | ICD-10-CM | POA: Insufficient documentation

## 2013-05-31 LAB — CBC WITH DIFFERENTIAL/PLATELET
Basophils Absolute: 0 10*3/uL (ref 0.0–0.1)
Eosinophils Absolute: 0 10*3/uL (ref 0.0–0.7)
Eosinophils Relative: 0 % (ref 0–5)
Lymphs Abs: 2.8 10*3/uL (ref 0.7–4.0)
MCH: 27.8 pg (ref 26.0–34.0)
Neutrophils Relative %: 55 % (ref 43–77)
Platelets: 249 10*3/uL (ref 150–400)
RBC: 4.13 MIL/uL (ref 3.87–5.11)
RDW: 13.8 % (ref 11.5–15.5)
WBC: 7.8 10*3/uL (ref 4.0–10.5)

## 2013-05-31 LAB — BASIC METABOLIC PANEL
Calcium: 10.3 mg/dL (ref 8.4–10.5)
GFR calc Af Amer: >90 mL/min (ref >90)
GFR calc non Af Amer: >90 mL/min (ref >90)
Glucose, Bld: 102 mg/dL — ABNORMAL HIGH (ref 70–99)
Potassium: 3.4 mEq/L — ABNORMAL LOW (ref 3.5–5.1)
Sodium: 138 mEq/L (ref 135–145)

## 2013-05-31 LAB — URINALYSIS, ROUTINE W REFLEX MICROSCOPIC
Leukocytes, UA: NEGATIVE
Nitrite: NEGATIVE
Protein, ur: NEGATIVE mg/dL
Specific Gravity, Urine: 1.023 (ref 1.005–1.030)
Urobilinogen, UA: 1 mg/dL (ref 0.0–1.0)

## 2013-05-31 LAB — URINE MICROSCOPIC-ADD ON

## 2013-05-31 LAB — POCT PREGNANCY, URINE: Preg Test, Ur: NEGATIVE

## 2013-05-31 MED ORDER — LORAZEPAM 2 MG/ML IJ SOLN
1.0000 mg | Freq: Once | INTRAMUSCULAR | Status: AC
  Administered 2013-05-31: 1 mg via INTRAVENOUS
  Filled 2013-05-31: qty 1

## 2013-05-31 NOTE — ED Notes (Signed)
Pt presents anxious- at lunch felt palpitation. Took medications - became anxious- here for evaluations for elevated heart rate. EDP at [facility] upon arrival to room Pt presents with NAD

## 2013-05-31 NOTE — ED Provider Notes (Signed)
History    CSN: [id number] Arrival date & time 05/31/13  1450  First MD Initiated Contact with Patient 05/31/13 1503     Chief Complaint  Patient presents with  . Palpitations  . Anxiety  . Hyperventilating   (Consider location/radiation/quality/duration/timing/severity/associated sxs/prior Treatment) Patient is a 47 y.o. female presenting with palpitations.  Palpitations Palpitations quality:  Fast Onset quality:  Sudden Timing:  Constant Progression:  Unchanged Chronicity:  New Context: caffeine and stimulant use (accidentlty took one extra vyvance.  )   Relieved by:  Nothing Ineffective treatments:  None tried Associated symptoms: chest pressure, dizziness and shortness of breath   Associated symptoms comment:  Feeling of impending doom  Past Medical History  Diagnosis Date  . Anemia   . Joint pain   . Seasonal allergies   . Edema of both legs   . Heart murmur     mild, no cardiologist  . PONV (postoperative nausea and vomiting)    Past Surgical History  Procedure Laterality Date  . Hernia repair  Umbilical  . Tubal ligation  1994  . 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 Hypertension Mother   . Diabetes Mother   . Arthritis Mother   . Anemia Mother   . Heart disease Maternal Grandmother    History  Substance Use Topics  . Smoking status: Never Smoker   . Smokeless tobacco: Never Used  . Alcohol Use: Yes     Comment: Occasional    OB History   Grav Para Term Preterm Abortions TAB SAB Ect Mult Living                 Review of Systems  Respiratory: Positive for shortness of breath.   Cardiovascular: Positive for palpitations.  Neurological: Positive for dizziness.  All other systems reviewed and are negative.    Allergies  Tomato; Benadryl;  Roxicet; and Sulfa antibiotics  Home Medications   Current Outpatient Rx  Name  Route  Sig  Dispense  Refill  . acetaminophen (TYLENOL) 500 MG tablet   Oral   Take 1,000 mg by mouth every 4 (four) hours as needed. For pain         . Calcium Citrate (CITRACAL PO)   Oral   Take by mouth.         . Cholecalciferol (WELLESSE VITAMIN D3) 1000 UNIT/10ML LIQD   Oral   Take 1,000 mcg by mouth daily.         . Cyanocobalamin (B-12 PO)   Sublingual   Place under the tongue daily.         . Fe Fum-FA-B Cmp-C-Zn-Mg-Mn-Cu (CENTRATEX PO)   Oral   Take by mouth.         . liothyronine (CYTOMEL) 5 MCG tablet      Take 1-2 pills in the morning.   60 tablet   2   . lisdexamfetamine (VYVANSE) 70 MG capsule   Oral   Take 1 capsule (70 mg total) by mouth every morning.   90 capsule   0   . ursodiol (ACTIGALL) 300 MG capsule   Oral   Take 300 mg by mouth 2 (two) times daily.          BP 118/72  Pulse 77  Temp(Src) 97.6 F (36.4 C) (  Oral)  Resp 16  Ht 5\' 7"  (1.702 m)  Wt 180 lb (81.647 kg)  BMI 28.19 kg/m2  SpO2 100%  LMP 05/17/2013 Physical Exam  Nursing note and vitals reviewed. Constitutional: She is oriented to person, place, and time. She appears well-developed and well-nourished. She appears distressed.  HENT:  Head: Normocephalic and atraumatic.  Mouth/Throat: Oropharynx is clear and moist.  Eyes: Conjunctivae are normal. Pupils are equal, round, and reactive to light. No scleral icterus.  Neck: Neck supple.  Cardiovascular: Normal rate, regular rhythm, normal heart sounds and intact distal pulses.   No murmur heard. Pulmonary/Chest: Breath sounds normal. No stridor. Tachypnea noted. No respiratory distress. She has no decreased breath sounds. She has no rales.  Abdominal: Soft. Bowel sounds are normal. She exhibits no distension. There is no tenderness.  Musculoskeletal: Normal range of motion.  Neurological: She is alert and oriented to person, place,  and time.  Skin: Skin is warm and dry. No rash noted.  Psychiatric: Her mood appears anxious.    ED Course  Procedures (including critical care time) Labs Reviewed  CBC WITH DIFFERENTIAL - Abnormal; Notable for the following:    Hemoglobin 11.5 (*)    HCT 34.9 (*)    All other components within normal limits  BASIC METABOLIC PANEL - Abnormal; Notable for the following:    Potassium 3.4 (*)    Glucose, Bld 102 (*)    All other components within normal limits  URINALYSIS, ROUTINE W REFLEX MICROSCOPIC - Abnormal; Notable for the following:    APPearance CLOUDY (*)    Hgb urine dipstick SMALL (*)    Ketones, ur 15 (*)    All other components within normal limits  URINE MICROSCOPIC-ADD ON - Abnormal; Notable for the following:    Squamous Epithelial / LPF FEW (*)    All other components within normal limits  POCT PREGNANCY, URINE   No results found.  EKG - NSR, rate 67, baseline artifact, normal axis, normal intervals, QTc 443, no ST/T changes, not significantly changed from prior.   1. Anxiety reaction     MDM  47 yo female presenting with hyperventilation and anxiety.  Seems to have been exacerbated by excedrin use and accidentally taking an extra Vyvance.  She repeatedly denies additional ingestions, accidental or otherwise, and her coworkers confirm this.  She calmed down well with controlled breathing and IV ativan.  labwork and EKG unremarkable.  She remained stable on telemetry.  She will follow up with her PCP.  She also requested referral to neurology because she has lost contact with her previous neurologist and wants to be re-evaluated for her migraine headaches.  Return precautions given.    Candyce Churn, MD 06/01/13 [phone]

## 2013-06-01 ENCOUNTER — Ambulatory Visit: Payer: 59 | Admitting: Family Medicine

## 2013-06-06 ENCOUNTER — Encounter: Payer: Self-pay | Admitting: Neurology

## 2013-06-06 ENCOUNTER — Ambulatory Visit (INDEPENDENT_AMBULATORY_CARE_PROVIDER_SITE_OTHER): Payer: 59 | Admitting: Family Medicine

## 2013-06-06 ENCOUNTER — Ambulatory Visit (INDEPENDENT_AMBULATORY_CARE_PROVIDER_SITE_OTHER): Payer: 59 | Admitting: Neurology

## 2013-06-06 ENCOUNTER — Encounter: Payer: Self-pay | Admitting: Family Medicine

## 2013-06-06 VITALS — BP 123/83 | HR 61 | Wt 183.0 lb

## 2013-06-06 DIAGNOSIS — F411 Generalized anxiety disorder: Secondary | ICD-10-CM

## 2013-06-06 DIAGNOSIS — G43009 Migraine without aura, not intractable, without status migrainosus: Secondary | ICD-10-CM

## 2013-06-06 MED ORDER — RIZATRIPTAN BENZOATE 10 MG PO TBDP: 10.0000 mg | ORAL_TABLET | Freq: Three times a day (TID) | ORAL | Status: AC | PRN

## 2013-06-06 MED ORDER — TOPIRAMATE 25 MG PO TABS: ORAL_TABLET | ORAL | Status: DC

## 2013-06-06 MED ORDER — ESCITALOPRAM OXALATE 5 MG PO TABS: 5.0000 mg | ORAL_TABLET | Freq: Every day | ORAL | Status: DC

## 2013-06-06 MED ORDER — LISDEXAMFETAMINE DIMESYLATE 70 MG PO CAPS: 70.0000 mg | ORAL_CAPSULE | ORAL | Status: DC

## 2013-06-06 NOTE — Progress Notes (Signed)
  Subjective:    Patient ID: Gail Gentry, female    DOB: 04/01/1966, 47 y.o.   MRN: [id number]  HPI  Gail Gentry is here today to discuss her anxiety.  She has been going through a lot of several personal problems and she feels that she has reached her limit.  She had a panic attack last week and ended up at the [facility] ER.  She was given Ativan while there and was instructed to follow up with her PCP.  She also needs her FMLA forms completed.      Review of Systems  Constitutional: Negative for fatigue and unexpected weight change.  Respiratory: Positive for chest tightness and shortness of breath.   Cardiovascular: Positive for palpitations. Negative for chest pain and leg swelling.  Genitourinary: Negative.   Neurological: Negative.   Psychiatric/Behavioral: Positive for decreased concentration and agitation. The patient is nervous/anxious.        Tearful    Past Medical History  Diagnosis Date  . Anemia   . Joint pain   . Seasonal allergies   . Edema of both legs   . Heart murmur     mild, no cardiologist  . PONV (postoperative nausea and vomiting)   . Phlebitis and thrombophlebitis of superficial vessels of lower extremities   . Symptomatic menopausal or female climacteric states   . Unspecified episodic mood disorder   . Anemia, unspecified   . Other specified disorder of skin   . Migraine   . Anxiety    Family History  Problem Relation Age of Onset  . Hypertension Mother   . Diabetes Mother   . Arthritis Mother   . Anemia Mother   . Heart disease Maternal Grandmother    History   Social History Narrative   Marital Status: Separated Archivist)   Children: Sons (Travarvis, Dance movement psychotherapist)    Pets:  Dog (1)     Living Situation: Lives with husband and sons.     Occupation: RT   Education: Automotive engineer   Tobacco Use/Exposure:  None    Alcohol Use:  Wine (1 x per month)    Drug Use:  None   Diet:  Regular   Exercise: Walking, Jump Rope, DVDs    Hobbies: Reading            Objective:   Physical Exam  Constitutional: She appears well-nourished. No distress.  HENT:  Head: Normocephalic.  Eyes: No scleral icterus.  Neck: No thyromegaly present.  Cardiovascular: Normal rate, regular rhythm and normal heart sounds.   Pulmonary/Chest: Effort normal and breath sounds normal.  Abdominal: There is no tenderness.  Musculoskeletal: She exhibits no edema and no tenderness.  Neurological: She is alert.  Skin: Skin is warm and dry.  Psychiatric: She has a normal mood and affect. Her behavior is normal. Judgment and thought content normal.  Stressed       Assessment & Plan:

## 2013-06-06 NOTE — Progress Notes (Signed)
Reason for visit: Migraine headache  Gail Gentry is a 47 y.o. female  History of present illness:  Gail Gentry is a 47 year old right-handed black female with a history of anxiety and depression and a history of migraine headaches since her high school years. The patient indicates that off and on during her life the headaches have been frequent and severe at times. The patient indicates that she had a significant amount of headaches in 2007 lasting until 2011. The patient indicates that after her gastric bypass procedure in 2012, her headaches disappeared until about 2 months ago. The headaches have returned associated with increased stress in her life. The patient has headaches around the right eye, occasionally on the left. The patient indicates that the headaches are throbbing in nature, associated with scalp tenderness. The patient may have blurred vision and "spots in front of the eyes". The patient may have nausea with the headache. The patient is having 1 or 2 headaches a week, and each headache may last 1 or 2 days. The patient denies any numbness or weakness of the face, arms, or legs. The patient does report some photophobia and phonophobia with the headache. Bright flashing lights may bring on headaches. The patient may have some slight confusion with the headache and some memory issues. The patient also reports some dizziness. The patient has had MRI evaluation in the past. 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 The patient currently is on no medications for her headache. The patient was placed on Depakote in April 2014, but she never took the medication.  Past Medical History  Diagnosis Date  . Anemia   . Joint pain   . Seasonal allergies   . Edema of both legs   . Heart murmur     mild, no cardiologist  . PONV (postoperative nausea and vomiting)   . Phlebitis and thrombophlebitis of superficial vessels of lower extremities   .  Symptomatic menopausal or female climacteric states   . Unspecified episodic mood disorder   . Anemia, unspecified   . Other specified disorder of skin   . Migraine   . Anxiety     Past Surgical History  Procedure Laterality Date  . Hernia repair  Umbilical  . Tubal ligation  1994  . 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 Hypertension Mother   . Diabetes Mother   . Arthritis Mother   . Anemia Mother   . Heart disease Maternal Grandmother     Social history:  reports that she has never smoked. She has never used smokeless tobacco. She reports that  drinks alcohol. She reports that she does not use illicit drugs.  Medications:  Current Outpatient Prescriptions on File Prior to Visit  Medication Sig Dispense Refill  . acetaminophen (TYLENOL) 500 MG tablet Take 1,000 mg by mouth every 4 (four) hours as needed. For pain      . Calcium Citrate (CITRACAL PO) Take by mouth.      . Cholecalciferol (WELLESSE VITAMIN D3) 1000 UNIT/10ML LIQD Take 1,000 mcg by mouth daily.      . Fe Fum-FA-B Cmp-C-Zn-Mg-Mn-Cu (CENTRATEX PO) Take by mouth.      . ursodiol (ACTIGALL) 300 MG capsule Take 300 mg by mouth 2 (two)  times daily.      Marland Kitchen escitalopram (LEXAPRO) 5 MG tablet Take 1 tablet (5 mg total) by mouth daily.  30 tablet  0  . lisdexamfetamine (VYVANSE) 70 MG capsule Take 1 capsule (70 mg total) by mouth every morning.  90 capsule  0   No current facility-administered medications on file prior to visit.    Allergies:  Allergies  Allergen Reactions  . Tomato Anaphylaxis  . Benadryl (Diphenhydramine Hcl) Itching  . Roxicet (Oxycodone-Acetaminophen)     rash  . Sulfa Antibiotics Rash    ROS:  Out of a complete 14 system review of symptoms, the patient complains only of the following  symptoms, and all other reviewed systems are negative.  Chills, fatigue Palpitations of the heart, heart murmur Dizziness, ringing in the ears Blurred vision, eye pain Incontinence of the bladder Anemia, easy bruising, enlarged lymph nodes Feeling cold Allergies Memory loss, confusion, headache, dizziness Depression, anxiety, insomnia, decreased energy, racing thoughts  Last menstrual period 05/17/2013.  Physical Exam  General: The patient is alert and cooperative at the time of the examination.  Head: Pupils are equal, round, and reactive to light. Discs are flat bilaterally.  Neck: The neck is supple, no carotid bruits are noted.  Respiratory: The respiratory examination is clear.  Cardiovascular: The cardiovascular examination reveals a regular rate and rhythm, no obvious murmurs or rubs are noted.  Skin: Extremities are without significant edema.  Neurologic Exam  Mental status:  Cranial nerves: Facial symmetry is present. There is good sensation of the face to pinprick and soft touch bilaterally. The strength of the facial muscles and the muscles to head turning and shoulder shrug are normal bilaterally. Speech is well enunciated, no aphasia or dysarthria is noted. Extraocular movements are full. Visual fields are full.  Motor: The motor testing reveals 5 over 5 strength of all 4 extremities. Good symmetric motor tone is noted throughout.  Sensory: Sensory testing is intact to pinprick, soft touch, vibration sensation, and position sense on all 4 extremities. No evidence of extinction is noted.  Coordination: Cerebellar testing reveals good finger-nose-finger and heel-to-shin bilaterally.  Gait and station: Gait is normal. Tandem gait is normal. Romberg is negative. No drift is seen.  Reflexes: Deep tendon reflexes are symmetric and normal bilaterally. Toes are downgoing bilaterally.   Assessment/Plan:  One. Migraine headache  2. Anxiety and depression  The  patient is having increased stress lately, and this has activated her headache. The patient will be placed on low-dose Topamax, and she will be given a prescription for Maxalt to take for the headache if needed. The patient is to followup in 3 or 4 months. We will readjust the Topamax dose if needed.  Marlan Palau MD 06/06/2013 7:42 PM  [facility] [address] [place], [place] [address]  Phone [phone] Fax [phone]

## 2013-06-06 NOTE — Patient Instructions (Addendum)
1)  Mood - Start on 1/2 of the Lexapro daily for 6 days then go to 1 tab.  F/U in 3 weeks.    Anxiety and Panic Attacks Your caregiver has informed you that you are having an anxiety or panic attack. There may be many forms of this. Most of the time these attacks come suddenly and without warning. They come at any time of day, including periods of sleep, and at any time of life. They may be strong and unexplained. Although panic attacks are very scary, they are physically harmless. Sometimes the cause of your anxiety is not known. Anxiety is a protective mechanism of the body in its fight or flight mechanism. Most of these perceived danger situations are actually nonphysical situations (such as anxiety over losing a job). CAUSES  The causes of an anxiety or panic attack are many. Panic attacks may occur in otherwise healthy people given a certain set of circumstances. There may be a genetic cause for panic attacks. Some medications may also have anxiety as a side effect. SYMPTOMS  Some of the most common feelings are:  Intense terror.  Dizziness, feeling faint.  Hot and cold flashes.  Fear of going crazy.  Feelings that nothing is real.  Sweating.  Shaking.  Chest pain or a fast heartbeat (palpitations).  Smothering, choking sensations.  Feelings of impending doom and that death is near.  Tingling of extremities, this may be from over-breathing.  Altered reality (derealization).  Being detached from yourself (depersonalization). Several symptoms can be present to make up anxiety or panic attacks. DIAGNOSIS  The evaluation by your caregiver will depend on the type of symptoms you are experiencing. The diagnosis of anxiety or panic attack is made when no physical illness can be determined to be a cause of the symptoms. TREATMENT  Treatment to prevent anxiety and panic attacks may include:  Avoidance of circumstances that cause anxiety.  Reassurance and relaxation.  Regular  exercise.  Relaxation therapies, such as yoga.  Psychotherapy with a psychiatrist or therapist.  Avoidance of caffeine, alcohol and illegal drugs.  Prescribed medication. SEEK IMMEDIATE MEDICAL CARE IF:   You experience panic attack symptoms that are different than your usual symptoms.  You have any worsening or concerning symptoms. Document Released: 11/03/2005 Document Revised: 01/26/2012 Document Reviewed: 03/07/2010 [facility] Patient Information 2014 [place], [place]. Nn

## 2013-06-17 ENCOUNTER — Emergency Department (HOSPITAL_COMMUNITY)
Admission: EM | Admit: 2013-06-17 | Discharge: 2013-06-17 | Disposition: A | Discharge status: Home / Self Care | Payer: 59 | Attending: Emergency Medicine | Admitting: Emergency Medicine

## 2013-06-17 ENCOUNTER — Encounter (HOSPITAL_COMMUNITY): Payer: Self-pay | Admitting: *Deleted

## 2013-06-17 ENCOUNTER — Emergency Department (HOSPITAL_COMMUNITY): Payer: 59

## 2013-06-17 DIAGNOSIS — F411 Generalized anxiety disorder: Secondary | ICD-10-CM | POA: Insufficient documentation

## 2013-06-17 DIAGNOSIS — Z872 Personal history of diseases of the skin and subcutaneous tissue: Secondary | ICD-10-CM | POA: Insufficient documentation

## 2013-06-17 DIAGNOSIS — Y929 Unspecified place or not applicable: Secondary | ICD-10-CM | POA: Insufficient documentation

## 2013-06-17 DIAGNOSIS — Z8742 Personal history of other diseases of the female genital tract: Secondary | ICD-10-CM | POA: Insufficient documentation

## 2013-06-17 DIAGNOSIS — D649 Anemia, unspecified: Secondary | ICD-10-CM | POA: Insufficient documentation

## 2013-06-17 DIAGNOSIS — W1809XA Striking against other object with subsequent fall, initial encounter: Secondary | ICD-10-CM | POA: Insufficient documentation

## 2013-06-17 DIAGNOSIS — Z8672 Personal history of thrombophlebitis: Secondary | ICD-10-CM | POA: Insufficient documentation

## 2013-06-17 DIAGNOSIS — R0781 Pleurodynia: Secondary | ICD-10-CM

## 2013-06-17 DIAGNOSIS — Y9389 Activity, other specified: Secondary | ICD-10-CM | POA: Insufficient documentation

## 2013-06-17 DIAGNOSIS — S298XXA Other specified injuries of thorax, initial encounter: Secondary | ICD-10-CM | POA: Insufficient documentation

## 2013-06-17 DIAGNOSIS — R011 Cardiac murmur, unspecified: Secondary | ICD-10-CM | POA: Insufficient documentation

## 2013-06-17 DIAGNOSIS — G43909 Migraine, unspecified, not intractable, without status migrainosus: Secondary | ICD-10-CM | POA: Insufficient documentation

## 2013-06-17 DIAGNOSIS — Z79899 Other long term (current) drug therapy: Secondary | ICD-10-CM | POA: Insufficient documentation

## 2013-06-17 DIAGNOSIS — Z8679 Personal history of other diseases of the circulatory system: Secondary | ICD-10-CM | POA: Insufficient documentation

## 2013-06-17 DIAGNOSIS — F319 Bipolar disorder, unspecified: Secondary | ICD-10-CM | POA: Insufficient documentation

## 2013-06-17 DIAGNOSIS — Z8659 Personal history of other mental and behavioral disorders: Secondary | ICD-10-CM | POA: Insufficient documentation

## 2013-06-17 MED ORDER — TRAMADOL HCL 50 MG PO TABS: 50.0000 mg | ORAL_TABLET | Freq: Four times a day (QID) | ORAL | Status: DC | PRN

## 2013-06-17 MED ORDER — KETOROLAC TROMETHAMINE 30 MG/ML IJ SOLN
30.0000 mg | Freq: Once | INTRAMUSCULAR | Status: AC
  Administered 2013-06-17: 30 mg via INTRAMUSCULAR
  Filled 2013-06-17: qty 1

## 2013-06-17 NOTE — ED Notes (Signed)
Pt reports fall Monday, was moving her washer and fell, states she heard a pop from her L rib cage.  Pt reports pain subsided, pain recurred when she moved a pt today.  Pt works at the [employer].  Pt reports pain is worse with movement and with inspiration.  Pt denies hitting her head.  Pt reports bruising after the fall.  No bruising noted but mild swelling is noted on her L side at this time.  Pt reports taking 2 tylenols x 2 hours ago.

## 2013-06-17 NOTE — ED Provider Notes (Signed)
CSN: [id number]     Arrival date & time 06/17/13  1310 History     First MD Initiated Contact with Patient 06/17/13 1311     Chief Complaint  Patient presents with  . Fall  . Rib Injury   (Consider location/radiation/quality/duration/timing/severity/associated sxs/prior Treatment) HPI  Patient presents with persistent left anterior axillary pain.  Pain began several days ago, she had trauma to the left side of her thorax.  She was moving something, fell against a solid object.  Since that time there's been increasing pain focally about the left anterior axillary region.  Pain is sharp, worse with motion or deep inspiration.  No concurrent dyspnea, chest pain, lightheadedness, syncope, fever, chills, cough. No relief with OTC medication.   Past Medical History  Diagnosis Date  . Anemia   . Joint pain   . Seasonal allergies   . Edema of both legs   . Heart murmur     mild, no cardiologist  . PONV (postoperative nausea and vomiting)   . Phlebitis and thrombophlebitis of superficial vessels of lower extremities   . Symptomatic menopausal or female climacteric states   . Unspecified episodic mood disorder   . Anemia, unspecified   . Other specified disorder of skin   . Migraine   . Anxiety    Past Surgical History  Procedure Laterality Date  . Hernia repair  Umbilical  . Tubal ligation  1994  . 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 Hypertension Mother   . Diabetes Mother   . Arthritis Mother   . Anemia Mother   . Heart disease Maternal Grandmother    History  Substance Use Topics  . Smoking status: Never Smoker   . Smokeless tobacco: Never Used  . Alcohol Use: Yes     Comment: Occasional    OB History   Grav Para Term Preterm Abortions TAB SAB Ect Mult Living            Review of Systems  All other systems reviewed and are negative.    Allergies  Tomato; Benadryl; Roxicet; and Sulfa antibiotics  Home Medications   Current Outpatient Rx  Name  Route  Sig  Dispense  Refill  . acetaminophen (TYLENOL) 500 MG tablet   Oral   Take 1,000 mg by mouth every 4 (four) hours as needed. For pain         . Calcium Citrate (CITRACAL PO)   Oral   Take by mouth.         . cholecalciferol (VITAMIN D) 1000 UNITS tablet   Oral   Take 1,000 Units by mouth daily.         Marland Kitchen escitalopram (LEXAPRO) 5 MG tablet   Oral   Take 1 tablet (5 mg total) by mouth daily.   30 tablet   0   . Fe Fum-FA-B Cmp-C-Zn-Mg-Mn-Cu (CENTRATEX PO)   Oral   Take by mouth.         . lisdexamfetamine (VYVANSE) 70 MG capsule   Oral   Take 1 capsule (70 mg total) by mouth every morning.   90 capsule   0   . rizatriptan (MAXALT-MLT) 10 MG disintegrating tablet   Oral   Take 1 tablet (10 mg total) by mouth 3 (three) times daily as needed  for migraine. May repeat in 2 hours if needed   12 tablet   3   . topiramate (TOPAMAX) 25 MG tablet      One tablet at night for one week, then take 2 tablets at night   60 tablet   3   . ursodiol (ACTIGALL) 300 MG capsule   Oral   Take 300 mg by mouth 2 (two) times daily.         . vitamin B-12 (CYANOCOBALAMIN) 1000 MCG tablet   Oral   Take 1,000 mcg by mouth daily.         . vitamin C (ASCORBIC ACID) 500 MG tablet   Oral   Take 500 mg by mouth daily.          BP 115/69  Pulse 66  Temp(Src) 98 F (36.7 C) (Oral)  Resp 18  SpO2 100%  LMP 06/15/2013 Physical Exam  Nursing note and vitals reviewed. Constitutional: She is oriented to person, place, and time. She appears well-developed and well-nourished. No distress.  HENT:  Head: Normocephalic and atraumatic.  Eyes: Conjunctivae and EOM are normal.  Cardiovascular: Normal rate and regular rhythm.   Pulmonary/Chest: Effort normal and breath sounds  normal. No stridor. No respiratory distress.  No gross deformity. Tenderness to palpation about the left anterior axilla with no crepitus  Abdominal: She exhibits no distension.  Musculoskeletal: She exhibits no edema.  Neurological: She is alert and oriented to person, place, and time. No cranial nerve deficit.  Skin: Skin is warm and dry.  Psychiatric: She has a normal mood and affect.    ED Course   Procedures (including critical care time)  Labs Reviewed - No data to display Dg Ribs Unilateral W/chest Left  06/17/2013   *RADIOLOGY REPORT*  Clinical Data:  left anterior rib pain  LEFT RIBS AND CHEST - 3+ VIEW  Comparison: 06/17/2011  Findings: Four views left ribs submitted.  No acute infiltrate or pulmonary edema. No left rib fracture is identified.  No diagnostic pneumothorax.  IMPRESSION: No left rib fracture is identified.  No diagnostic pneumothorax.   Original Report Authenticated By: Natasha Mead, M.D.   No diagnosis found. Pulse oximetry 100% room air normal I reviewed the x-ray, agree with the interpretation. MDM  Patient presents after minor trauma with pain persistently in the left lower axilla.  On exam she is not hypoxic, tachypneic, tachycardic or in distress.  Patient's x-ray does not demonstrate fracture or pneumothorax.  Patient's pain is likely to contusion versus disruption of intercostal muscle.  Discharged in stable condition with cryotherapy, analgesics.  Gerhard Munch, MD 06/17/13 1421

## 2013-06-17 NOTE — ED Notes (Signed)
Patient transported to X-ray 

## 2013-06-17 NOTE — ED Notes (Signed)
Patient taught to use incentive spirometer every hour while awake. Patient demonstrated correct use of incentive spirometer.

## 2013-06-17 NOTE — ED Notes (Signed)
Patient reports that yesterday her feet went numb on the bottom for a while, which later went away.

## 2013-06-26 DIAGNOSIS — F411 Generalized anxiety disorder: Secondary | ICD-10-CM | POA: Insufficient documentation

## 2013-06-26 NOTE — Assessment & Plan Note (Signed)
She is to start on Lexapro and follow up in 2 weeks.

## 2013-06-27 ENCOUNTER — Encounter: Payer: Self-pay | Admitting: Family Medicine

## 2013-06-27 ENCOUNTER — Ambulatory Visit (INDEPENDENT_AMBULATORY_CARE_PROVIDER_SITE_OTHER): Payer: 59 | Admitting: Family Medicine

## 2013-06-27 VITALS — BP 113/66 | HR 72 | Resp 16 | Ht 67.0 in | Wt 182.0 lb

## 2013-06-27 DIAGNOSIS — R0781 Pleurodynia: Secondary | ICD-10-CM

## 2013-06-27 DIAGNOSIS — F411 Generalized anxiety disorder: Secondary | ICD-10-CM

## 2013-06-27 DIAGNOSIS — R079 Chest pain, unspecified: Secondary | ICD-10-CM

## 2013-06-27 DIAGNOSIS — R41840 Attention and concentration deficit: Secondary | ICD-10-CM

## 2013-06-27 MED ORDER — ESCITALOPRAM OXALATE 5 MG PO TABS: 5.0000 mg | ORAL_TABLET | Freq: Every day | ORAL | Status: AC

## 2013-06-27 MED ORDER — LISDEXAMFETAMINE DIMESYLATE 70 MG PO CAPS: 70.0000 mg | ORAL_CAPSULE | ORAL | Status: DC

## 2013-06-27 NOTE — Progress Notes (Signed)
  Subjective:    Patient ID: Gail Gentry, female    DOB: 1966/06/28, 47 y.o.   MRN: [id number]  HPI  Gail Gentry is here today needing to have some forms completed (short term disability/FMLA).  She recently fell and injured her side.  She did not fracture a rib but was told she bruised them.  She continues to have a lot of pain.    Review of Systems  Constitutional: Negative.   Musculoskeletal:       Left side rib pain  Psychiatric/Behavioral: Negative for sleep disturbance. The patient is not nervous/anxious.        Her sleep has really improved Her anxiety has improved    Past Medical History  Diagnosis Date  . Anemia   . Joint pain   . Seasonal allergies   . Edema of both legs   . Heart murmur     mild, no cardiologist  . PONV (postoperative nausea and vomiting)   . Phlebitis and thrombophlebitis of superficial vessels of lower extremities   . Symptomatic menopausal or female climacteric states   . Unspecified episodic mood disorder   . Anemia, unspecified   . Other specified disorder of skin   . Migraine   . Anxiety     Family History  Problem Relation Age of Onset  . Hypertension Mother   . Diabetes Mother   . Arthritis Mother   . Anemia Mother   . Heart disease Maternal Grandmother     History   Social History Narrative   Marital Status: Separated Archivist)   Children: Sons (Travarvis, Dance movement psychotherapist)    Pets:  Dog (1)     Living Situation: Lives with husband and sons.     Occupation: RT   Education: Automotive engineer   Tobacco Use/Exposure:  None    Alcohol Use:  Wine (1 x per month)    Drug Use:  None   Diet:  Regular   Exercise: Walking, Jump Rope, DVDs    Hobbies: Reading            Objective:   Physical Exam  Constitutional: She appears well-nourished.  Musculoskeletal: She exhibits tenderness (Left side is painful. ).          Assessment & Plan:

## 2013-08-13 DIAGNOSIS — R0781 Pleurodynia: Secondary | ICD-10-CM | POA: Insufficient documentation

## 2013-08-13 NOTE — Assessment & Plan Note (Signed)
Her short-term disability forms and FMLA forms were completed.

## 2013-08-13 NOTE — Assessment & Plan Note (Signed)
Her mood is controlled on Lexapro.  She was given refills for 5 mg.

## 2013-08-13 NOTE — Assessment & Plan Note (Signed)
Her Vyvanse was refilled.

## 2013-09-22 ENCOUNTER — Other Ambulatory Visit: Payer: Self-pay

## 2013-09-27 ENCOUNTER — Ambulatory Visit: Payer: 59 | Admitting: Family Medicine

## 2013-09-30 ENCOUNTER — Ambulatory Visit (INDEPENDENT_AMBULATORY_CARE_PROVIDER_SITE_OTHER): Payer: 59 | Admitting: Family Medicine

## 2013-09-30 ENCOUNTER — Encounter: Payer: Self-pay | Admitting: Family Medicine

## 2013-09-30 VITALS — BP 117/79 | HR 71 | Resp 16 | Ht 67.0 in | Wt 180.0 lb

## 2013-09-30 DIAGNOSIS — R41840 Attention and concentration deficit: Secondary | ICD-10-CM

## 2013-09-30 MED ORDER — AMPHETAMINE-DEXTROAMPHET ER 30 MG PO CP24: 30.0000 mg | ORAL_CAPSULE | Freq: Every day | ORAL | Status: DC

## 2013-09-30 NOTE — Progress Notes (Signed)
  Subjective:    Patient ID: Gail Gentry, female    DOB: 1966/02/16, 47 y.o.   MRN: [id number]  HPI  Josue is here today to discuss the conditions listed below:   1)  Depression:  She has been taking her Depakote (1500 mg at night) and Lexapro 5 mg (daily).  She feels that her mood is not stable.  She is concerned about how much she is absorbing due to her gastric bypass surgery.  She is also concerned about weight gain due to her Lexapro.    2)  ADD:  She feels that her Vyvanse (70 mg) is not helping her for as long as it used to.      3)  Irregular Menstrual Cycles:  She was previously having very painful, irregular periods.  She has not had a period since September.    4)  Knots in her left armpit:  She has noted some knots in her left arm pit for several weeks.      Review of Systems  Constitutional: Positive for unexpected weight change.  HENT: Negative.   Eyes: Negative.   Respiratory: Negative.   Endocrine: Negative.   Genitourinary: Positive for menstrual problem.  Skin:       Knots in left armpit  Allergic/Immunologic: Negative.   Neurological: Negative.   Psychiatric/Behavioral: Positive for decreased concentration.     Past Medical History  Diagnosis Date  . Anemia   . Joint pain   . Seasonal allergies   . Edema of both legs   . Heart murmur     mild, no cardiologist  . PONV (postoperative nausea and vomiting)   . Phlebitis and thrombophlebitis of superficial vessels of lower extremities   . Symptomatic menopausal or female climacteric states   . Unspecified episodic mood disorder   . Anemia, unspecified   . Other specified disorder of skin   . Migraine   . Anxiety      Family History  Problem Relation Age of Onset  . Hypertension Mother   . Diabetes Mother   . Arthritis Mother   . Anemia Mother   . Heart disease Maternal Grandmother     History   Social History Narrative   Marital Status: Separated Archivist)   Children: Sons  (Travarvis, Dance movement psychotherapist)    Pets:  Dog (1)     Living Situation: Lives with husband and sons.     Occupation: RT   Education: Automotive engineer   Tobacco Use/Exposure:  None    Alcohol Use:  Wine (1 x per month)    Drug Use:  None   Diet:  Regular   Exercise: Walking, Jump Rope, DVDs    Hobbies: Reading             Objective:   Physical Exam  Vitals reviewed. Constitutional: She is oriented to person, place, and time. She appears well-developed and well-nourished.  Cardiovascular: Normal rate and regular rhythm.   Pulmonary/Chest: Effort normal and breath sounds normal.  Musculoskeletal:  Axilla is normal.    Neurological: She is alert and oriented to person, place, and time.  Skin: Skin is warm and dry.  Psychiatric: She has a normal mood and affect.      Assessment & Plan:    Emmelia was seen today for medication management.  Diagnoses and associated orders for this visit:  Concentration deficit - amphetamine-dextroamphetamine (ADDERALL XR) 30 MG 24 hr capsule; Take 1 capsule (30 mg total) by mouth daily.

## 2013-10-06 ENCOUNTER — Encounter: Payer: Self-pay | Admitting: *Deleted

## 2013-10-06 ENCOUNTER — Other Ambulatory Visit: Payer: Self-pay | Admitting: Family Medicine

## 2013-10-06 DIAGNOSIS — E559 Vitamin D deficiency, unspecified: Secondary | ICD-10-CM

## 2013-10-06 MED ORDER — VITAMIN D (ERGOCALCIFEROL) 1.25 MG (50000 UNIT) PO CAPS: ORAL_CAPSULE | ORAL | Status: AC

## 2013-10-06 NOTE — Telephone Encounter (Signed)
error 

## 2013-10-07 ENCOUNTER — Ambulatory Visit: Payer: 59 | Admitting: Nurse Practitioner

## 2013-10-31 ENCOUNTER — Ambulatory Visit: Payer: 59 | Admitting: Family Medicine

## 2013-10-31 ENCOUNTER — Ambulatory Visit (INDEPENDENT_AMBULATORY_CARE_PROVIDER_SITE_OTHER): Payer: 59 | Admitting: Family Medicine

## 2013-10-31 ENCOUNTER — Encounter: Payer: Self-pay | Admitting: Family Medicine

## 2013-10-31 VITALS — BP 123/76 | HR 61 | Resp 16 | Wt 190.0 lb

## 2013-10-31 DIAGNOSIS — N72 Inflammatory disease of cervix uteri: Secondary | ICD-10-CM

## 2013-10-31 DIAGNOSIS — N760 Acute vaginitis: Secondary | ICD-10-CM

## 2013-10-31 DIAGNOSIS — Z780 Asymptomatic menopausal state: Secondary | ICD-10-CM

## 2013-10-31 DIAGNOSIS — E876 Hypokalemia: Secondary | ICD-10-CM

## 2013-10-31 DIAGNOSIS — Z7721 Contact with and (suspected) exposure to potentially hazardous body fluids: Secondary | ICD-10-CM

## 2013-10-31 DIAGNOSIS — R490 Dysphonia: Secondary | ICD-10-CM

## 2013-10-31 DIAGNOSIS — R609 Edema, unspecified: Secondary | ICD-10-CM

## 2013-10-31 DIAGNOSIS — N951 Menopausal and female climacteric states: Secondary | ICD-10-CM

## 2013-10-31 DIAGNOSIS — D649 Anemia, unspecified: Secondary | ICD-10-CM

## 2013-10-31 LAB — POCT WET PREP (WET MOUNT)

## 2013-10-31 MED ORDER — POTASSIUM BICARB-CITRIC ACID 20 MEQ PO TBEF: 1.0000 | EFFERVESCENT_TABLET | Freq: Two times a day (BID) | ORAL | Status: AC

## 2013-10-31 MED ORDER — AZITHROMYCIN 1 G PO PACK: 2.0000 g | PACK | Freq: Once | ORAL | Status: AC

## 2013-10-31 MED ORDER — METRONIDAZOLE 0.75 % VA GEL: 1.0000 | Freq: Two times a day (BID) | VAGINAL | Status: DC

## 2013-10-31 MED ORDER — MAGIC MOUTHWASH: 5.0000 mL | Freq: Four times a day (QID) | ORAL | Status: AC | PRN

## 2013-10-31 MED ORDER — FUROSEMIDE 80 MG PO TABS: 40.0000 mg | ORAL_TABLET | Freq: Two times a day (BID) | ORAL | Status: AC

## 2013-10-31 NOTE — Progress Notes (Signed)
Subjective:    Patient ID: Gail Gentry, female    DOB: 1966-04-04, 47 y.o.   MRN: [id number]  HPI  Gail Gentry is here today complaining of edema and vaginal discharge.  Her hands and legs have been swollen for the past two weeks.  She also feels that her eyes are puffy.  She is taking Lasix but it has not helped her.  She also has been having severe leg cramping.     Review of Systems  HENT: Positive for voice change.   Cardiovascular: Positive for leg swelling. Negative for chest pain and palpitations.  Genitourinary: Positive for vaginal discharge.  Musculoskeletal:       Leg cramps.    All other systems reviewed and are negative.     Past Medical History  Diagnosis Date  . Anemia   . Joint pain   . Seasonal allergies   . Edema of both legs   . Heart murmur     mild, no cardiologist  . PONV (postoperative nausea and vomiting)   . Phlebitis and thrombophlebitis of superficial vessels of lower extremities   . Symptomatic menopausal or female climacteric states   . Unspecified episodic mood disorder   . Anemia, unspecified   . Other specified disorder of skin   . Migraine   . Anxiety      Past Surgical History  Procedure Laterality Date  . Hernia repair  Umbilical  . Tubal ligation  1994  . 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     Occupation: RT   Education: Automotive engineer   Tobacco Use/Exposure:  None    Alcohol Use:  Wine (1 x per month)    Drug Use:  None   Diet:  Regular   Exercise: Walking, Jump Rope, DVDs    Hobbies: Reading           Family History  Problem Relation Age of Onset  . Hypertension Mother   .  Diabetes Mother   . Arthritis Mother   . Anemia Mother   . Heart disease Maternal Grandmother      Current Outpatient Prescriptions on File Prior to Visit  Medication Sig Dispense Refill  . acetaminophen (TYLENOL) 500 MG tablet Take 1,000 mg by mouth every 4 (four) hours as needed. For pain      . Calcium Citrate (CITRACAL PO) Take by mouth.      . cholecalciferol (VITAMIN D) 1000 UNITS tablet Take 1,000 Units by mouth daily.      . cyanocobalamin (,VITAMIN B-12,) 1000 MCG/ML injection Inject 1,000 mcg into the muscle 2 (two) times a week.      . divalproex (DEPAKOTE ER) 500 MG 24 hr tablet Take 500 mg by mouth daily. She is going to take one at night.      . escitalopram (LEXAPRO) 5 MG tablet Take 1 tablet (5 mg total) by mouth daily.  90 tablet  1  . Fe Fum-FA-B Cmp-C-Zn-Mg-Mn-Cu (CENTRATEX PO) Take by mouth.      . lisdexamfetamine (VYVANSE) 70 MG capsule Take 1 capsule (70 mg total) by mouth every morning.  90 capsule  0  . rizatriptan (MAXALT-MLT) 10 MG disintegrating tablet Take 1 tablet (10 mg total) by mouth 3 (three) times daily as needed for migraine. May repeat in 2 hours if needed  12 tablet  3  . topiramate (TOPAMAX) 25 MG tablet One tablet at night for one week, then take 2 tablets at night  60 tablet  3  . ursodiol (ACTIGALL) 300 MG capsule Take 300 mg by mouth 2 (two) times daily.      . vitamin C (ASCORBIC ACID) 500 MG tablet Take 500 mg by mouth daily.      . Vitamin D, Ergocalciferol, (DRISDOL) 50000 UNITS CAPS capsule Take 1 capsule po 3 x per week (M/W/F)  36 capsule  3  . acetaminophen-codeine (TYLENOL #3) 300-30 MG per tablet Take 1 tablet by mouth every 4 (four) hours as needed for pain.      Marland Kitchen amphetamine-dextroamphetamine (ADDERALL XR) 30 MG 24 hr capsule Take 1 capsule (30 mg total) by mouth daily.  90 capsule  0   No current facility-administered medications on file prior to visit.     Allergies  Allergen Reactions  . Tomato Anaphylaxis  . Benadryl  [Diphenhydramine Hcl] Itching  . Roxicet [Oxycodone-Acetaminophen]     rash  . Sulfa Antibiotics Rash     Immunization History  Administered Date(s) Administered  . Tdap 09/03/2009      Objective:   Physical Exam  Nursing note and vitals reviewed. Constitutional: She is oriented to person, place, and time. She appears well-developed and well-nourished.  Eyes: No scleral icterus.  Neck: No thyromegaly present.  Cardiovascular: Normal rate and regular rhythm.   Pulmonary/Chest: Effort normal and breath sounds normal.  Abdominal: Soft. She exhibits no distension and no mass. There is no tenderness.  Genitourinary: Vaginal discharge found.  Neurological: She is alert and oriented to person, place, and time.  Skin: Skin is warm.  Psychiatric: She has a normal mood and affect. Her behavior is normal. Judgment and thought content normal.      Assessment & Plan:    Vienne was seen today for edema and vaginal discharge.    Diagnoses and associated orders for this visit:  Anemia - CBC with Differential  Low blood potassium - COMPLETE METABOLIC PANEL WITH GFR - Potassium Bicarb-Citric Acid 20 MEQ TBEF; Take 1 tablet (20 mEq total) by mouth 2 (two) times daily.  Menopause - FSH/LH  Hx of exposure to hazardous bodily fluids - Hepatitis C Antibody - HIV antibody - Hepatitis B surface antibody - HSV 2 antibody, IgG - RPR  Cervicitis - azithromycin (ZITHROMAX) 1 G powder; Take 2 packets (2 g total) by mouth once. Drink slowly tonight over 30 mins before bedtime - GC/Chlamydia Probe Amp  Vaginitis and vulvovaginitis, unspecified - metroNIDAZOLE (METROGEL VAGINAL) 0.75 % vaginal gel; Place 1 Applicatorful vaginally 2 (two) times daily. Use for 5 days  Hoarseness of voice - Alum & Mag Hydroxide-Simeth (MAGIC MOUTHWASH) SOLN; Take 5 mLs by mouth 4 (four) times daily as needed for mouth pain.  Edema - furosemide (LASIX) 80 MG tablet; Take 0.5 tablets (40 mg total) by mouth 2  (two) times daily. Take for edema PRN  TIME SPENT "FACE TO FACE" WITH PATIENT -  30 MINS

## 2013-11-01 ENCOUNTER — Encounter (INDEPENDENT_AMBULATORY_CARE_PROVIDER_SITE_OTHER): Payer: Self-pay | Admitting: General Surgery

## 2013-11-01 LAB — COMPLETE METABOLIC PANEL WITH GFR
ALT: 41 U/L — ABNORMAL HIGH (ref 0–35)
AST: 53 U/L — ABNORMAL HIGH (ref 0–37)
Albumin: 4 g/dL (ref 3.5–5.2)
Alkaline Phosphatase: 48 U/L (ref 39–117)
BUN: 9 mg/dL (ref 6–23)
CO2: 28 mEq/L (ref 19–32)
Calcium: 9.3 mg/dL (ref 8.4–10.5)
Chloride: 106 mEq/L (ref 96–112)
Creat: 0.65 mg/dL (ref 0.50–1.10)
GFR, Est African American: >89 mL/min
GFR, Est Non African American: >89 mL/min
Glucose, Bld: 81 mg/dL (ref 70–99)
Potassium: 3.9 mEq/L (ref 3.5–5.3)
Sodium: 139 mEq/L (ref 135–145)
Total Bilirubin: 0.6 mg/dL (ref 0.3–1.2)
Total Protein: 6.4 g/dL (ref 6.0–8.3)

## 2013-11-01 LAB — HEPATITIS C ANTIBODY: HCV Ab: NEGATIVE

## 2013-11-01 LAB — HEPATITIS B SURFACE ANTIBODY, QUANTITATIVE: Hep B S AB Quant (Post): 0.6 m[IU]/mL

## 2013-11-01 LAB — RPR

## 2013-11-01 LAB — FSH/LH
FSH: 54 m[IU]/mL
LH: 35 m[IU]/mL

## 2013-11-01 LAB — HSV 2 ANTIBODY, IGG: HSV 2 Glycoprotein G Ab, IgG: 0.14 IV

## 2013-11-01 LAB — GC/CHLAMYDIA PROBE AMP
CT Probe RNA: NEGATIVE
GC Probe RNA: NEGATIVE

## 2013-11-01 LAB — HIV ANTIBODY (ROUTINE TESTING W REFLEX): HIV: NONREACTIVE

## 2013-11-02 LAB — CBC WITH DIFFERENTIAL/PLATELET

## 2013-11-03 ENCOUNTER — Other Ambulatory Visit: Payer: Self-pay | Admitting: *Deleted

## 2013-11-03 DIAGNOSIS — D509 Iron deficiency anemia, unspecified: Secondary | ICD-10-CM

## 2013-11-03 DIAGNOSIS — R5381 Other malaise: Secondary | ICD-10-CM

## 2013-11-03 DIAGNOSIS — E559 Vitamin D deficiency, unspecified: Secondary | ICD-10-CM

## 2013-11-18 ENCOUNTER — Telehealth: Payer: Self-pay | Admitting: *Deleted

## 2013-11-18 NOTE — Telephone Encounter (Signed)
Reminded her to go to any [facility] station to get her blood drawn before her next office visit. PG

## 2013-12-01 ENCOUNTER — Ambulatory Visit: Payer: 59 | Admitting: Family Medicine

## 2013-12-08 ENCOUNTER — Ambulatory Visit: Payer: 59 | Admitting: Family Medicine

## 2013-12-16 ENCOUNTER — Ambulatory Visit (INDEPENDENT_AMBULATORY_CARE_PROVIDER_SITE_OTHER): Payer: 59 | Admitting: Family Medicine

## 2013-12-16 ENCOUNTER — Ambulatory Visit (INDEPENDENT_AMBULATORY_CARE_PROVIDER_SITE_OTHER): Payer: Commercial Managed Care - PPO | Admitting: General Surgery

## 2013-12-16 ENCOUNTER — Encounter: Payer: Self-pay | Admitting: Family Medicine

## 2013-12-16 VITALS — BP 120/77 | HR 90 | Resp 16 | Ht 66.5 in | Wt 194.0 lb

## 2013-12-16 DIAGNOSIS — R945 Abnormal results of liver function studies: Secondary | ICD-10-CM

## 2013-12-16 DIAGNOSIS — D649 Anemia, unspecified: Secondary | ICD-10-CM

## 2013-12-16 DIAGNOSIS — R7989 Other specified abnormal findings of blood chemistry: Secondary | ICD-10-CM

## 2013-12-16 DIAGNOSIS — R41840 Attention and concentration deficit: Secondary | ICD-10-CM

## 2013-12-16 DIAGNOSIS — G43909 Migraine, unspecified, not intractable, without status migrainosus: Secondary | ICD-10-CM

## 2013-12-16 DIAGNOSIS — K219 Gastro-esophageal reflux disease without esophagitis: Secondary | ICD-10-CM

## 2013-12-16 LAB — CBC WITH DIFFERENTIAL/PLATELET
Basophils Absolute: 0 10*3/uL (ref 0.0–0.1)
Basophils Relative: 0 % (ref 0–1)
Eosinophils Absolute: 0.1 10*3/uL (ref 0.0–0.7)
Eosinophils Relative: 3 % (ref 0–5)
HCT: 35.4 % — ABNORMAL LOW (ref 36.0–46.0)
Hemoglobin: 11.5 g/dL — ABNORMAL LOW (ref 12.0–15.0)
Lymphocytes Relative: 41 % (ref 12–46)
Lymphs Abs: 2.1 10*3/uL (ref 0.7–4.0)
MCH: 28 pg (ref 26.0–34.0)
MCHC: 32.5 g/dL (ref 30.0–36.0)
MCV: 86.1 fL (ref 78.0–100.0)
Monocytes Absolute: 0.5 10*3/uL (ref 0.1–1.0)
Monocytes Relative: 9 % (ref 3–12)
Neutro Abs: 2.4 10*3/uL (ref 1.7–7.7)
Neutrophils Relative %: 47 % (ref 43–77)
Platelets: 261 10*3/uL (ref 150–400)
RBC: 4.11 MIL/uL (ref 3.87–5.11)
RDW: 14.3 % (ref 11.5–15.5)
WBC: 5.1 10*3/uL (ref 4.0–10.5)

## 2013-12-16 MED ORDER — TOPIRAMATE 50 MG PO TABS
50.0000 mg | ORAL_TABLET | Freq: Two times a day (BID) | ORAL | Status: AC
Start: 2013-12-16 — End: 2014-12-16

## 2013-12-16 MED ORDER — LISDEXAMFETAMINE DIMESYLATE 70 MG PO CAPS: 70.0000 mg | ORAL_CAPSULE | ORAL | Status: AC

## 2013-12-16 MED ORDER — PANTOPRAZOLE SODIUM 40 MG PO TBEC: 40.0000 mg | DELAYED_RELEASE_TABLET | Freq: Every day | ORAL | Status: AC

## 2013-12-16 NOTE — Progress Notes (Signed)
Subjective:    Patient ID: Gail Gentry, female    DOB: 05-29-1966, 48 y.o.   MRN: [id number]  HPI  Gail Gentry is here today to go over her most recent lab results.  She also needs to discuss the conditions listed below:   1)  ADD -  She is here today to get a refill on her ADD medication (Vyvanse 70 mg).  She says that the medication continues to help her concentrate at work.  She feels that this dosage is appropriate and would like to continue on it.     2)  Migraine Headches - She continues to control her headaches with the combination of Topamax and Maxalt.  She needs a refill on her Maxalt.    3)  Abdominal Discomfort - She has been struggling with abdominal discomfort and issues with digestion.  She feels that this problem worsened in the past two weeks.  She has tried [employer], [employer] in the past which helped her some.     Review of Systems  Constitutional: Positive for appetite change. Negative for fatigue and unexpected weight change.  Cardiovascular: Negative for chest pain and palpitations.  Gastrointestinal: Positive for abdominal pain and abdominal distention.  Neurological: Negative for dizziness and headaches.  Psychiatric/Behavioral: Positive for decreased concentration. Negative for behavioral problems, sleep disturbance, dysphoric mood and agitation. The patient is not nervous/anxious and is not hyperactive.      Past Medical History  Diagnosis Date  . Anemia   . Joint pain   . Seasonal allergies   . Edema of both legs   . Heart murmur     mild, no cardiologist  . PONV (postoperative nausea and vomiting)   . Phlebitis and thrombophlebitis of superficial vessels of lower extremities   . Symptomatic menopausal or female climacteric states   . Unspecified episodic mood disorder   . Anemia, unspecified   . Other specified disorder of skin   . Migraine   . Anxiety      Past Surgical History  Procedure Laterality Date  . Hernia repair  Umbilical  . Tubal ligation   1994  . 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     Occupation: RT   Education: [employer]   Tobacco Use/Exposure:  None    Alcohol Use:  Wine (1 x per month)    Drug Use:  None   Diet:  Regular   Exercise: Walking, Jump Rope, DVDs    Hobbies: Reading              Family History  Problem Relation Age of Onset  . Hypertension Mother   . Diabetes Mother   . Arthritis Mother   . Anemia Mother   . Heart disease Maternal Grandmother      Current Outpatient Prescriptions on File Prior to Visit  Medication Sig Dispense Refill  . acetaminophen (TYLENOL) 500 MG tablet Take 1,000 mg by mouth every 4 (four) hours as needed. For pain      . Alum & Mag Hydroxide-Simeth (MAGIC MOUTHWASH) SOLN Take 5 mLs by mouth 4 (four) times daily as needed for mouth pain.  240 mL  0  . Calcium Citrate (CITRACAL PO) Take by mouth.      . cyanocobalamin (,VITAMIN B-12,) 1000 MCG/ML injection Inject 1,000 mcg into the muscle 2 (two) times a week.      . divalproex (DEPAKOTE ER) 500 MG 24 hr tablet Take 500 mg by mouth daily. She is going to take one at night.      . escitalopram (LEXAPRO) 5 MG tablet Take 1 tablet (5 mg total) by mouth daily.  90 tablet  1  . Fe Fum-FA-B Cmp-C-Zn-Mg-Mn-Cu (CENTRATEX PO) Take by mouth.      . furosemide (LASIX) 80 MG tablet Take 0.5 tablets (40 mg total) by mouth 2 (two) times daily. Take for edema PRN  90 tablet  3  . Potassium Bicarb-Citric Acid 20 MEQ TBEF Take 1 tablet (20 mEq total) by mouth 2 (two) times daily.  180 each  3  . vitamin C (ASCORBIC ACID) 500 MG tablet Take 500 mg by mouth daily.      . Vitamin D, Ergocalciferol, (DRISDOL)  50000 UNITS CAPS capsule Take 1 capsule po 3 x per week (M/W/F)  36 capsule  3  . rizatriptan (MAXALT-MLT) 10 MG disintegrating tablet Take 1 tablet (10 mg total) by mouth 3 (three) times daily as needed for migraine. May repeat in 2 hours if needed  12 tablet  3   No current facility-administered medications on file prior to visit.     Allergies  Allergen Reactions  . Tomato Anaphylaxis  . Benadryl [Diphenhydramine Hcl] Itching  . Roxicet [Oxycodone-Acetaminophen]     rash  . Sulfa Antibiotics Rash     Immunization History  Administered Date(s) Administered  . Tdap 09/03/2009      Objective:   Physical Exam  Vitals reviewed. Constitutional: She is oriented to person, place, and time.  Eyes: Conjunctivae are normal. No scleral icterus.  Neck: Neck supple. No thyromegaly present.  Cardiovascular: Normal rate, regular rhythm and normal heart sounds.   Pulmonary/Chest: Effort normal and breath sounds normal.  Abdominal: Soft. Bowel sounds are normal. She exhibits no distension and no mass. There is no tenderness. There is no rebound and no guarding.  Musculoskeletal: She exhibits no edema and no tenderness.  Lymphadenopathy:    She has no cervical adenopathy.  Neurological: She is alert and oriented to person, place, and time.  Skin: Skin is warm and dry.  Psychiatric: She has a normal mood and affect. Her behavior is normal. Judgment and thought content normal.      Assessment & Plan:    Gail Gentry was seen today for medication management.  Diagnoses and associated orders for this visit:  Concentration deficit - lisdexamfetamine (VYVANSE) 70 MG capsule; Take 1 capsule (70 mg total) by mouth every morning. Refilled x 3 months.    Migraine, unspecified, without mention of intractable migraine without mention of status migrainosus - topiramate (TOPAMAX) 50 MG tablet; Take 1 tablet (50 mg total) by mouth 2 (two) times daily.  GERD (gastroesophageal reflux  disease) - pantoprazole (PROTONIX) 40 MG tablet; Take 1 tablet (40 mg total) by mouth daily.  Anemia - CBC w/Diff  Elevated LFTs - COMPLETE METABOLIC PANEL WITH GFR

## 2013-12-17 LAB — COMPLETE METABOLIC PANEL WITH GFR
ALT: 41 U/L — ABNORMAL HIGH (ref 0–35)
AST: 40 U/L — ABNORMAL HIGH (ref 0–37)
Albumin: 4.1 g/dL (ref 3.5–5.2)
Alkaline Phosphatase: 62 U/L (ref 39–117)
BUN: 11 mg/dL (ref 6–23)
CO2: 30 mEq/L (ref 19–32)
Calcium: 9 mg/dL (ref 8.4–10.5)
Chloride: 103 mEq/L (ref 96–112)
Creat: 0.73 mg/dL (ref 0.50–1.10)
GFR, Est African American: >89 mL/min
GFR, Est Non African American: >89 mL/min
Glucose, Bld: 68 mg/dL — ABNORMAL LOW (ref 70–99)
Potassium: 4.3 mEq/L (ref 3.5–5.3)
Sodium: 138 mEq/L (ref 135–145)
Total Bilirubin: 0.4 mg/dL (ref 0.2–1.2)
Total Protein: 6.9 g/dL (ref 6.0–8.3)

## 2013-12-20 ENCOUNTER — Encounter: Payer: Self-pay | Admitting: *Deleted

## 2013-12-21 ENCOUNTER — Encounter (INDEPENDENT_AMBULATORY_CARE_PROVIDER_SITE_OTHER): Payer: Self-pay | Admitting: General Surgery

## 2013-12-22 ENCOUNTER — Encounter: Payer: Self-pay | Admitting: *Deleted

## 2014-01-16 ENCOUNTER — Ambulatory Visit: Payer: 59 | Admitting: Family Medicine

## 2014-01-19 ENCOUNTER — Other Ambulatory Visit: Payer: Self-pay

## 2014-01-19 DIAGNOSIS — Z1231 Encounter for screening mammogram for malignant neoplasm of breast: Secondary | ICD-10-CM

## 2014-03-07 ENCOUNTER — Ambulatory Visit: Payer: 59

## 2014-11-30 ENCOUNTER — Encounter (HOSPITAL_COMMUNITY): Payer: Self-pay | Admitting: Surgery

## 2016-08-20 ENCOUNTER — Encounter (HOSPITAL_COMMUNITY): Payer: Self-pay

## 2016-10-02 ENCOUNTER — Telehealth (HOSPITAL_COMMUNITY): Payer: Self-pay

## 2016-10-02 NOTE — Telephone Encounter (Signed)
This patient is overdue for recommended follow-[facility]. A letter was mailed to the address on file 08/20/16 from both [place] & [facility] in attempt to reestablish post-op care. The letter included a patient survey which [facility].  Patient declined an appointment Ulyses Southwardadvisingshe has moved out of town & too far to drive now. Has yet to transfer post-op care & requested referral for provider closer to her. Copy of the survey was shared with Dario GuardianFrances Jackson at [facility] so she may follow-up on referral & file in patients office chart.
